# Patient Record
Sex: Female | Born: 1970 | Race: Black or African American | Hispanic: No | Marital: Married | Smoking: Never smoker
Health system: Southern US, Community
[De-identification: ages and names within clinical notes are randomized; demographics above are authoritative.]

## PROBLEM LIST (undated history)

## (undated) DIAGNOSIS — IMO0001 Reserved for inherently not codable concepts without codable children: Secondary | ICD-10-CM

## (undated) DIAGNOSIS — E039 Hypothyroidism, unspecified: Secondary | ICD-10-CM

## (undated) DIAGNOSIS — K219 Gastro-esophageal reflux disease without esophagitis: Secondary | ICD-10-CM

## (undated) DIAGNOSIS — R002 Palpitations: Secondary | ICD-10-CM

## (undated) DIAGNOSIS — R030 Elevated blood-pressure reading, without diagnosis of hypertension: Secondary | ICD-10-CM

## (undated) DIAGNOSIS — Z9289 Personal history of other medical treatment: Secondary | ICD-10-CM

## (undated) HISTORY — DX: Gastro-esophageal reflux disease without esophagitis: K21.9

## (undated) HISTORY — DX: Personal history of other medical treatment: Z92.89

## (undated) HISTORY — DX: Palpitations: R00.2

## (undated) HISTORY — DX: Elevated blood-pressure reading, without diagnosis of hypertension: R03.0

## (undated) HISTORY — DX: Hypothyroidism, unspecified: E03.9

## (undated) HISTORY — DX: Reserved for inherently not codable concepts without codable children: IMO0001

## (undated) HISTORY — PX: WISDOM TOOTH EXTRACTION: SHX21

---

## 1998-08-25 ENCOUNTER — Other Ambulatory Visit: Admission: RE | Admit: 1998-08-25 | Discharge: 1998-08-25 | Payer: Self-pay | Admitting: Obstetrics and Gynecology

## 1999-11-30 ENCOUNTER — Emergency Department (HOSPITAL_COMMUNITY): Admission: EM | Admit: 1999-11-30 | Discharge: 1999-11-30 | Payer: Self-pay | Admitting: Emergency Medicine

## 2000-08-14 ENCOUNTER — Other Ambulatory Visit: Admission: RE | Admit: 2000-08-14 | Discharge: 2000-08-14 | Payer: Self-pay | Admitting: Obstetrics and Gynecology

## 2001-02-15 ENCOUNTER — Inpatient Hospital Stay (HOSPITAL_COMMUNITY): Admission: AD | Admit: 2001-02-15 | Discharge: 2001-02-15 | Payer: Self-pay | Admitting: Obstetrics & Gynecology

## 2001-02-16 ENCOUNTER — Inpatient Hospital Stay (HOSPITAL_COMMUNITY): Admission: AD | Admit: 2001-02-16 | Discharge: 2001-02-18 | Payer: Self-pay | Admitting: Obstetrics & Gynecology

## 2001-04-03 ENCOUNTER — Other Ambulatory Visit: Admission: RE | Admit: 2001-04-03 | Discharge: 2001-04-03 | Payer: Self-pay | Admitting: Obstetrics and Gynecology

## 2002-05-25 ENCOUNTER — Other Ambulatory Visit: Admission: RE | Admit: 2002-05-25 | Discharge: 2002-05-25 | Payer: Self-pay | Admitting: Obstetrics and Gynecology

## 2002-08-17 ENCOUNTER — Ambulatory Visit (HOSPITAL_COMMUNITY): Admission: RE | Admit: 2002-08-17 | Discharge: 2002-08-17 | Payer: Self-pay | Admitting: Physical Therapy

## 2002-08-17 ENCOUNTER — Encounter: Admission: RE | Admit: 2002-08-17 | Discharge: 2002-08-17 | Payer: Self-pay | Admitting: Obstetrics and Gynecology

## 2002-08-17 ENCOUNTER — Encounter: Payer: Self-pay | Admitting: Obstetrics and Gynecology

## 2003-06-09 ENCOUNTER — Other Ambulatory Visit: Admission: RE | Admit: 2003-06-09 | Discharge: 2003-06-09 | Payer: Self-pay | Admitting: Obstetrics and Gynecology

## 2004-10-02 ENCOUNTER — Other Ambulatory Visit: Admission: RE | Admit: 2004-10-02 | Discharge: 2004-10-02 | Payer: Self-pay | Admitting: Obstetrics and Gynecology

## 2005-05-28 ENCOUNTER — Other Ambulatory Visit: Admission: RE | Admit: 2005-05-28 | Discharge: 2005-05-28 | Payer: Self-pay | Admitting: Obstetrics and Gynecology

## 2011-05-16 HISTORY — PX: INTRAUTERINE DEVICE (IUD) INSERTION: SHX5877

## 2012-12-10 ENCOUNTER — Encounter: Payer: Self-pay | Admitting: Cardiology

## 2012-12-10 ENCOUNTER — Encounter: Payer: Self-pay | Admitting: *Deleted

## 2012-12-10 ENCOUNTER — Other Ambulatory Visit: Payer: Self-pay | Admitting: *Deleted

## 2012-12-10 ENCOUNTER — Ambulatory Visit (INDEPENDENT_AMBULATORY_CARE_PROVIDER_SITE_OTHER): Payer: BC Managed Care – PPO | Admitting: Cardiology

## 2012-12-10 VITALS — BP 142/90 | HR 76 | Ht 66.5 in | Wt 195.0 lb

## 2012-12-10 DIAGNOSIS — I10 Essential (primary) hypertension: Secondary | ICD-10-CM

## 2012-12-10 DIAGNOSIS — R002 Palpitations: Secondary | ICD-10-CM

## 2012-12-10 DIAGNOSIS — R9431 Abnormal electrocardiogram [ECG] [EKG]: Secondary | ICD-10-CM

## 2012-12-10 NOTE — Patient Instructions (Addendum)
Your physician has recommended that you wear a holter monitor. Holter monitors are medical devices that record the heart's electrical activity. Doctors most often use these monitors to diagnose arrhythmias. Arrhythmias are problems with the speed or rhythm of the heartbeat. The monitor is a small, portable device. You can wear one while you do your normal daily activities. This is usually used to diagnose what is causing palpitations/syncope (passing out). 48 hour monitor  Your physician has requested that you have an echocardiogram. Echocardiography is a painless test that uses sound waves to create images of your heart. It provides your doctor with information about the size and shape of your heart and how well your heart's chambers and valves are working. This procedure takes approximately one hour. There are no restrictions for this procedure.  Your physician has requested that you regularly monitor and record your blood pressure readings at home. Please use the same machine at the same time of day to check your readings. I will call you in 2 weeks to get the readings. Luana Shu [phone]  Your physician recommends that you schedule a follow-up appointment in: 2 months with NP or PA.  Take Nexium every day.

## 2012-12-11 DIAGNOSIS — R9431 Abnormal electrocardiogram [ECG] [EKG]: Secondary | ICD-10-CM | POA: Insufficient documentation

## 2012-12-11 DIAGNOSIS — I10 Essential (primary) hypertension: Secondary | ICD-10-CM | POA: Insufficient documentation

## 2012-12-11 DIAGNOSIS — R002 Palpitations: Secondary | ICD-10-CM | POA: Insufficient documentation

## 2012-12-11 NOTE — Progress Notes (Signed)
Patient ID: Sheena Miller, female   DOB: [birth year], 42 y.o.   MRN: [id number] PCP: Candice Camp  42 yo presents for evaluation of palpitations.  Patient feels her heart flutter/skip beats.  This happened once before in 2009.  The heart fluttering was associated with reflux symptoms and resolved with Nexium use.  More recently, she has been having episodes for the last few months of heart fluttering/skipping again.  Episodes occur throughout the day and will recur for several days before stopping for a week or so then restarting.  She does not feel her heart race, it just seems irregular and fluttery.  It is bothersome but does not cause lightheadedness or syncope/presyncope.  Episodes do seem to be related to GERD symptoms (substernal burning) after meals.  Nexium helps the GERD but has not really helped the palpitations this time so she is not taking it.  Her TSH was normal at her PCP's office per her report about a month ago.  She carries the diagnosis of HTN and BP is borderline elevated today.  She tells me that she checks her BP on a cuff at home and it is almost always within normal range.   ECG: NSR, inferior T wave inversions, anterolateral T wave inversions  PMH: 1. Hypothyroidism 2. HTN (no meds at this time) 3. GERD  SH: Lives in [place], works in a Medical laboratory scientific officer, nonsmoker  FH: No premature CAD, no known arrhythmias  ROS: All systems reviewed and negative except as per HPI.   Current Outpatient Prescriptions  Medication Sig Dispense Refill  . levonorgestrel (MIRENA) 20 MCG/24HR IUD 1 each by Intrauterine route once.      Marland Kitchen levothyroxine (SYNTHROID, LEVOTHROID) 112 MCG tablet Take 1 tablet by mouth daily.       No current facility-administered medications for this visit.    BP 142/90  Pulse 76  Ht 5' 6.5" (1.689 m)  Wt 195 lb (88.451 kg)  BMI 31.01 kg/m2 General: NAD Neck: No JVD, no thyromegaly or thyroid nodule.  Lungs: Clear to auscultation bilaterally with normal  respiratory effort. CV: Nondisplaced PMI.  Heart regular S1/S2, no S3/S4, no murmur.  No peripheral edema.  No carotid bruit.  Normal pedal pulses.  Abdomen: Soft, nontender, no hepatosplenomegaly, no distention.  Skin: Intact without lesions or rashes.  Neurologic: Alert and oriented x 3.  Psych: Normal affect. Extremities: No clubbing or cyanosis.  HEENT: Normal.   Assessment/Plan: 1. Palpitations: By history, I suspect PACs or PVCs.  They do seem to be associated with GERD.  I will have her wear a 48 hour monitor to document and to make sure we are not missing paroxysmal atrial fibrillation.  She does not drink much caffeine and says she gets a good amount of sleep at night.  Thyroid indices were normal when last checked recently (on Levoxyl).  I suggested that she try going back on Nexium every day to see if the palpitations will subside over time if she suppresses the GERD.   2. HTN: Borderline in office, says BP is normal at home.  She has a home cuff.  She will check it daily at various times for 2 wks and record readings.  I will have my nurse call her in 2 wks to see what BP is running.  3. Abnormal ECG: Could this be related to LVH from HTN? I will get an echo to make sure that heart is structurally normal.  This may simply be the variant that will be normal  for her.   She will followup in a few weeks to see how the palpitations are doing while on Nexium.   Marca Ancona 12/11/2012

## 2012-12-19 ENCOUNTER — Telehealth: Payer: Self-pay | Admitting: *Deleted

## 2012-12-19 ENCOUNTER — Encounter (INDEPENDENT_AMBULATORY_CARE_PROVIDER_SITE_OTHER): Payer: BC Managed Care – PPO

## 2012-12-19 ENCOUNTER — Ambulatory Visit (HOSPITAL_COMMUNITY): Payer: BC Managed Care – PPO | Attending: Cardiology | Admitting: Radiology

## 2012-12-19 ENCOUNTER — Other Ambulatory Visit: Payer: Self-pay

## 2012-12-19 DIAGNOSIS — R9431 Abnormal electrocardiogram [ECG] [EKG]: Secondary | ICD-10-CM

## 2012-12-19 DIAGNOSIS — R002 Palpitations: Secondary | ICD-10-CM

## 2012-12-19 NOTE — Telephone Encounter (Signed)
48 hr holter monitor placed on Pt 12/19/12 TK

## 2012-12-19 NOTE — Progress Notes (Signed)
Echocardiogram performed.  

## 2012-12-24 ENCOUNTER — Telehealth: Payer: Self-pay | Admitting: *Deleted

## 2012-12-24 NOTE — Telephone Encounter (Signed)
F/u ° ° °Pt returned your call °

## 2012-12-24 NOTE — Telephone Encounter (Signed)
BPs ok 

## 2012-12-24 NOTE — Telephone Encounter (Signed)
Pt.notified

## 2012-12-24 NOTE — Telephone Encounter (Signed)
.   HTN: Borderline in office, says BP is normal at home. She has a home cuff. She will check it daily at various times for 2 wks and record readings. I will have my nurse call her in 2 wks to see what BP is running.    Spoke with patient--recent BP readings--12/11/12 117/84 12/12/12 132/86 12/13/12 127/81 12/14/12 129/88 12/15/12 112/65 12/16/12 117/74 12/17/12 130/90 12/18/12 126/90 12/19/12 130/84 12/21/12 126/85 12/22/12 138/93. I will forward to Dr Shirlee Latch for review.

## 2013-01-08 ENCOUNTER — Telehealth: Payer: Self-pay | Admitting: *Deleted

## 2013-01-08 NOTE — Telephone Encounter (Signed)
Dr Shirlee Latch reviewed monitor done 12/19/12 --pt notified of results.

## 2013-02-09 ENCOUNTER — Ambulatory Visit (INDEPENDENT_AMBULATORY_CARE_PROVIDER_SITE_OTHER): Payer: BC Managed Care – PPO | Admitting: Physician Assistant

## 2013-02-09 ENCOUNTER — Encounter: Payer: Self-pay | Admitting: Physician Assistant

## 2013-02-09 VITALS — BP 140/90 | HR 64 | Ht 66.5 in | Wt 198.0 lb

## 2013-02-09 DIAGNOSIS — I10 Essential (primary) hypertension: Secondary | ICD-10-CM

## 2013-02-09 DIAGNOSIS — R002 Palpitations: Secondary | ICD-10-CM

## 2013-02-09 DIAGNOSIS — K219 Gastro-esophageal reflux disease without esophagitis: Secondary | ICD-10-CM | POA: Insufficient documentation

## 2013-02-09 MED ORDER — ESOMEPRAZOLE MAGNESIUM 40 MG PO CPDR: 40.0000 mg | DELAYED_RELEASE_CAPSULE | Freq: Every day | ORAL | Status: DC

## 2013-02-09 NOTE — Progress Notes (Signed)
[address] [place], [place]  [address] Phone: [phone] Fax:  [phone]  Date:  02/09/2013   ID:  Sheena Miller, DOB 1970-12-14, MRN [id number]  PCP:  Turner Daniels, MD  Cardiologist:  Dr. Marca Ancona     History of Present Illness: Sheena Miller is a 42 y.o. female who returns for follow up on palpitations.   She saw Dr. Marca Ancona recently for evaluation of palpitations.  This happened once before in 2009. The heart fluttering was associated with reflux symptoms and resolved with Nexium use. More recently, she had been having episodes for the last few months of heart fluttering/skipping again. Episodes occur throughout the day and will recur for several days before stopping for a week or so then restarting. She does not feel her heart race, it just seems irregular and fluttery. It is bothersome but does not cause lightheadedness or syncope/presyncope. Episodes do seem to be related to GERD symptoms (substernal burning) after meals. Nexium helps the GERD but had not really helped the palpitations this time so she had not been taking it. Her TSH was normal at her PCP's office per her report about a month prior.  ECG was abnormal.  Dr. Marca Ancona arranged an echo and Holter monitor.  Echo 6/14: EF 55-65%, normal wall motion. 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  Since going back on her Nexium, she has had no further palpitations. She denies chest pain, shortness of breath, syncope.  Wt Readings from Last 3 Encounters:  02/09/13 198 lb (89.812 kg)  12/10/12 195 lb (88.451 kg)     Past Medical History  Diagnosis Date  . 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  . Elevated  blood pressure     no meds at this time  . Hypothyroidism   . GERD (gastroesophageal reflux disease)   . Hx of echocardiogram     Echo 6/14: EF 55-65%, normal wall motion.    Current Outpatient Prescriptions  Medication Sig Dispense Refill  . esomeprazole (NEXIUM) 40 MG capsule Take 40 mg by mouth daily before breakfast.      . levonorgestrel (MIRENA) 20 MCG/24HR IUD 1 each by Intrauterine route once.      Marland Kitchen levothyroxine (SYNTHROID, LEVOTHROID) 112 MCG tablet Take 1 tablet by mouth daily.       No current facility-administered medications for this visit.    Allergies:    Allergies  Allergen Reactions  . Dust Mite Extract   . Pollen Extract     Social History:  The patient  reports that she has never smoked. She has never used smokeless tobacco. She reports that she does not use illicit drugs.   ROS:  Please see the history of present illness.   She denies dysphagia, odynophagia, melena, hematochezia, unintended weight loss.   All other systems reviewed and negative.   PHYSICAL EXAM: VS:  BP 140/90  Pulse 64  Ht 5' 6.5" (1.689 m)  Wt 198 lb (89.812 kg)  BMI 31.48 kg/m2 Well nourished, well developed, in no acute distress HEENT: normal Neck: no JVD Cardiac:  normal S1, S2; RRR; no murmur Lungs:  clear to auscultation bilaterally, no wheezing, rhonchi or rales  Abd: soft, nontender, no hepatomegaly Ext: no edema Skin: warm and dry Neuro:  CNs 2-12 intact, no focal abnormalities noted  EKG:  NSR, HR 64, normal axis, NSSTTW changes     ASSESSMENT AND PLAN:  1. Palpitations:  Resolve with treatment of GERD. Continue PPI. 2. GERD:  Continue PPI. No high-risk symptoms noted. 3. Hypertension:  Blood pressures at home are optimal. She likely has "whitecoat hypertension." 4. Disposition:  Follow up with Dr. Shirlee Latch in 6 months.  Signed, Tereso Newcomer, PA-C  02/09/2013 8:45 AM

## 2013-02-09 NOTE — Patient Instructions (Addendum)
No change in your medications today. Schedule follow up with Dr. Marca Ancona in 6 months.

## 2016-01-30 ENCOUNTER — Other Ambulatory Visit: Payer: Self-pay | Admitting: Obstetrics and Gynecology

## 2016-01-30 DIAGNOSIS — R928 Other abnormal and inconclusive findings on diagnostic imaging of breast: Secondary | ICD-10-CM

## 2016-01-31 ENCOUNTER — Other Ambulatory Visit: Payer: Self-pay | Admitting: Obstetrics and Gynecology

## 2016-01-31 DIAGNOSIS — R928 Other abnormal and inconclusive findings on diagnostic imaging of breast: Secondary | ICD-10-CM

## 2016-02-03 ENCOUNTER — Ambulatory Visit
Admission: RE | Admit: 2016-02-03 | Discharge: 2016-02-03 | Disposition: A | Discharge status: Home / Self Care | Payer: BLUE CROSS/BLUE SHIELD | Source: Ambulatory Visit | Attending: Obstetrics and Gynecology | Admitting: Obstetrics and Gynecology

## 2016-02-03 DIAGNOSIS — R928 Other abnormal and inconclusive findings on diagnostic imaging of breast: Secondary | ICD-10-CM

## 2018-01-09 IMAGING — MG 2D DIGITAL DIAGNOSTIC UNILATERAL RIGHT MAMMOGRAM WITH CAD AND AD
6 of 9 series · 6 of 21 positions shown · non-contrast
Comparison: Previous exam(s).

CLINICAL DATA: Patient was called back from screening mammogram for
a possible mass in the right breast.

EXAM:
2D DIGITAL DIAGNOSTIC RIGHT MAMMOGRAM WITH CAD AND ADJUNCT TOMO
ULTRASOUND RIGHT BREAST

[R MLO]
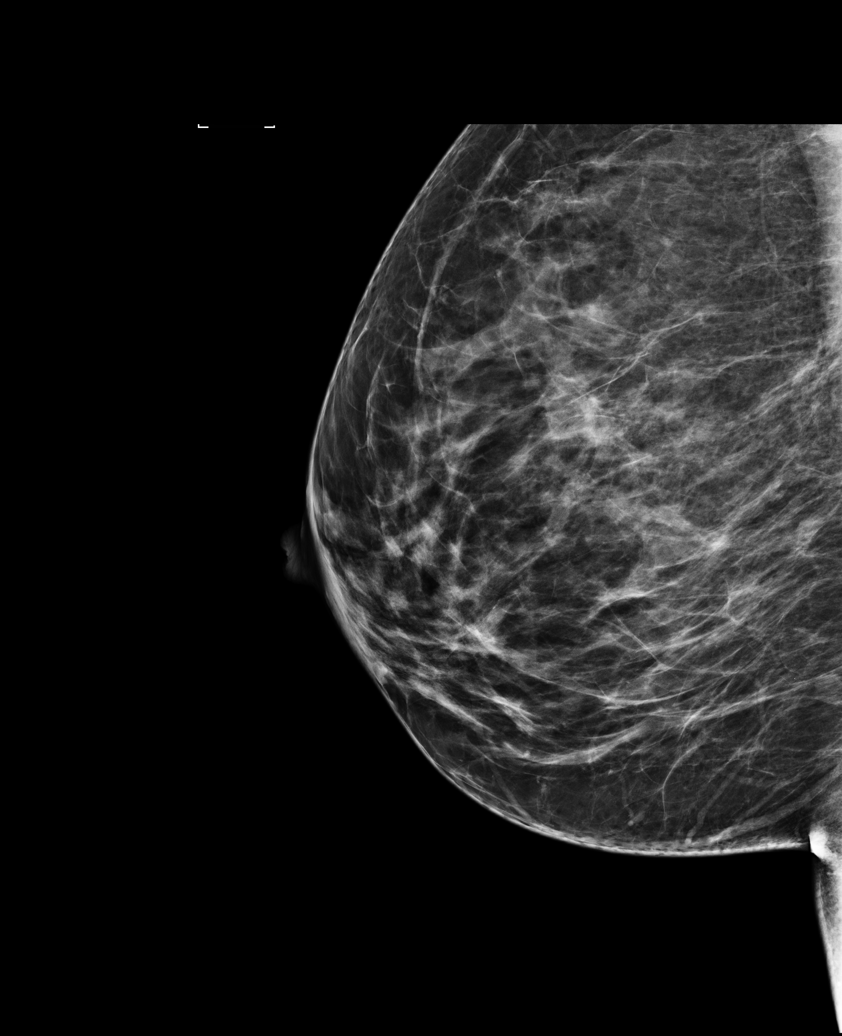

[R CC]
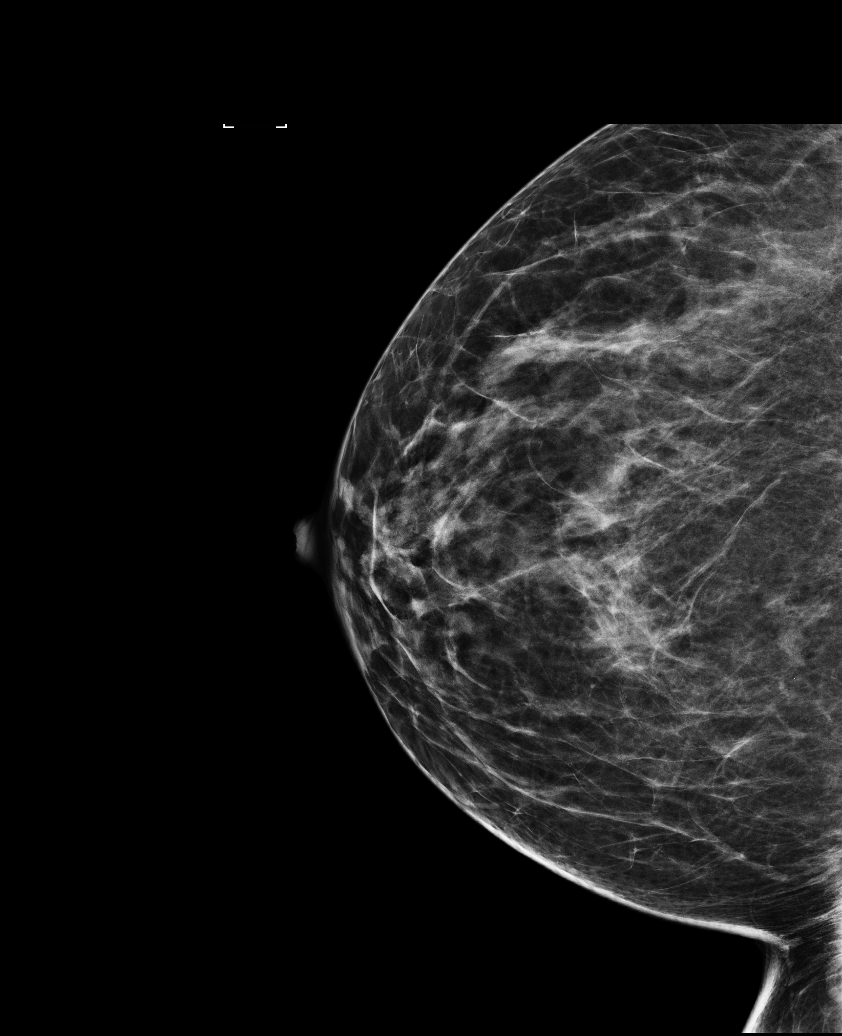

[R MLO synth-2D]
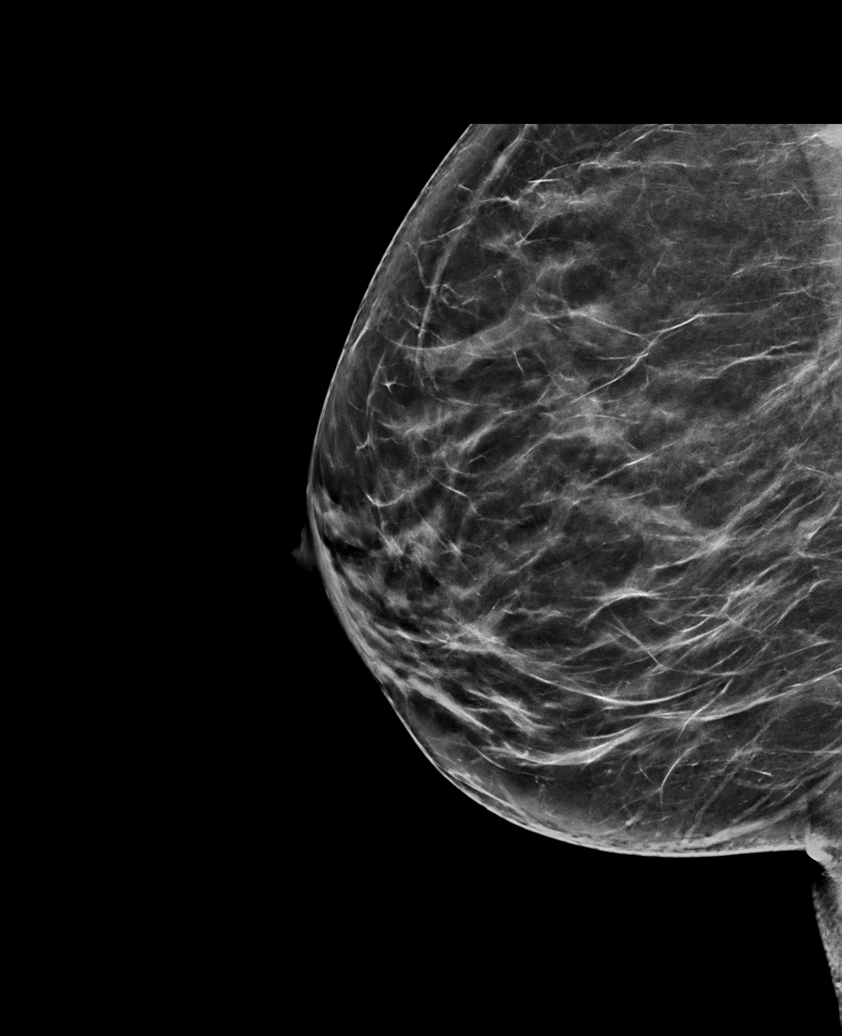

[R XCCL synth-2D]
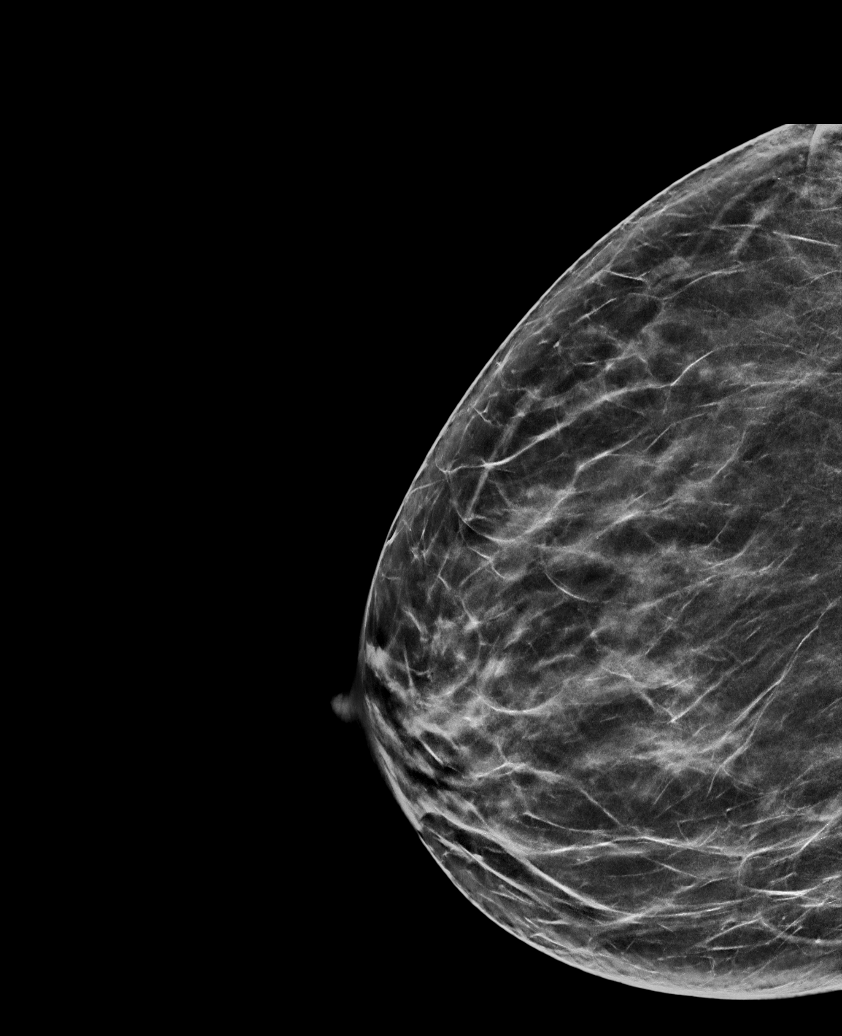

[R XCCL]
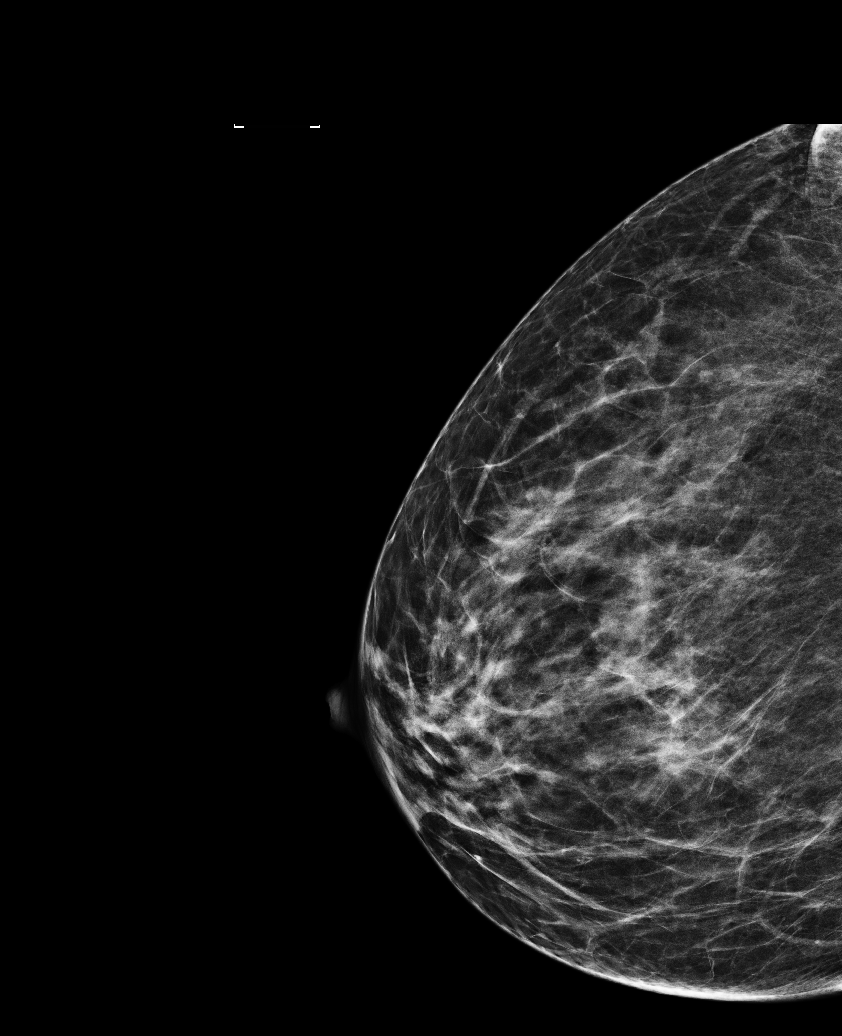

[R CC synth-2D]
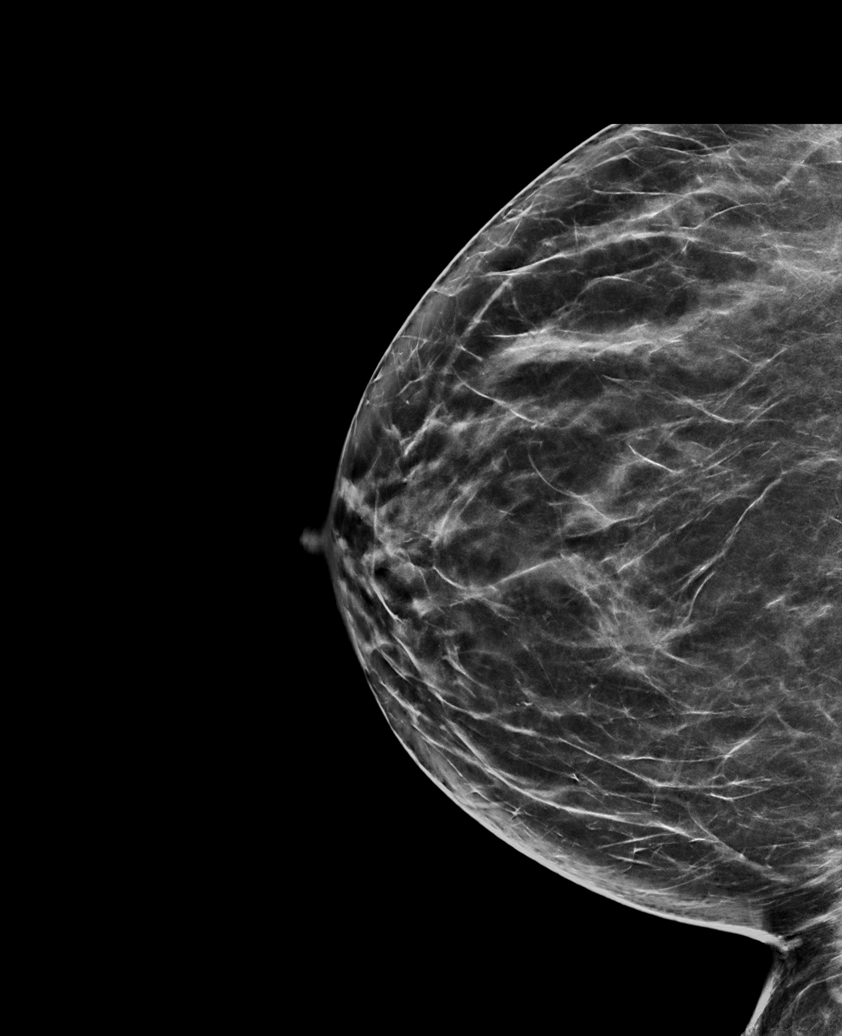

[6 of 21 positions shown; findings below may reference images not displayed]

ACR Breast Density Category b: There are scattered areas of
fibroglandular density.
FINDINGS: Additional imaging of the right breast was performed. Far
posteriorly in the inferior aspect of the right breast is a
well-circumscribed 7 mm nodule. It has internal fat. On the CC view
the nodule is felt to be medial. It is felt to have a similar
appearance to the 01/07/2015 study on the MLO view. Mammogram prior
to this dated 11/06/2013 tissue was not imaged this far posteriorly.
There are no malignant type microcalcifications.

Mammographic images were processed with CAD.

On physical exam, I do not palpate a mass in the inferior aspect of
the right breast.

Targeted ultrasound is performed, showing normal tissue throughout
the entirety inferior aspect of the right breast. No solid or cystic
mass, abnormal shadowing or distortion visualized.
IMPRESSION: Probable benign nodule in the right breast.

RECOMMENDATION:
Short-term interval followup right mammogram possible ultrasound in
6 months is recommended.

I have discussed the findings and recommendations with the patient.
Results were also provided in writing at the conclusion of the
visit. If applicable, a reminder letter will be sent to the patient
regarding the next appointment.

BI-RADS CATEGORY  3: Probably benign.

## 2019-06-18 ENCOUNTER — Other Ambulatory Visit: Payer: Self-pay | Admitting: Obstetrics and Gynecology

## 2019-06-18 DIAGNOSIS — N6310 Unspecified lump in the right breast, unspecified quadrant: Secondary | ICD-10-CM

## 2019-06-22 ENCOUNTER — Other Ambulatory Visit: Payer: BLUE CROSS/BLUE SHIELD

## 2019-06-29 ENCOUNTER — Other Ambulatory Visit: Payer: Self-pay

## 2019-06-29 ENCOUNTER — Ambulatory Visit
Admission: RE | Admit: 2019-06-29 | Discharge: 2019-06-29 | Disposition: A | Discharge status: Home / Self Care | Payer: BLUE CROSS/BLUE SHIELD | Source: Ambulatory Visit | Attending: Obstetrics and Gynecology | Admitting: Obstetrics and Gynecology

## 2019-06-29 DIAGNOSIS — N6310 Unspecified lump in the right breast, unspecified quadrant: Secondary | ICD-10-CM

## 2021-06-04 IMAGING — US US BREAST*R* LIMITED INC AXILLA
2 series · 13 of 13 positions shown · non-contrast
Comparison: Previous exam(s).

CLINICAL DATA: Interval follow-up of a likely benign mass or focal
asymmetry in the INNER RIGHT breast at POSTERIOR depth. Annual
evaluation, LEFT breast. The patient's most recent prior mammogram
was in January 2016.

EXAM:
DIGITAL DIAGNOSTIC BILATERAL MAMMOGRAM WITH CAD AND TOMO
ULTRASOUND RIGHT BREAST

[Series 1: us breast*right* limited inc axilla · 0.07mm/px · 7 of 7 slices shown (1 of 2)]
[im 1/7]
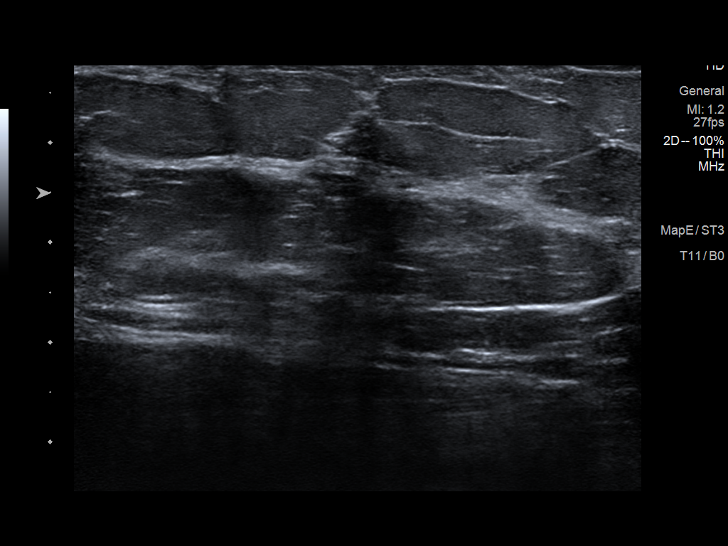
[im 2/7]
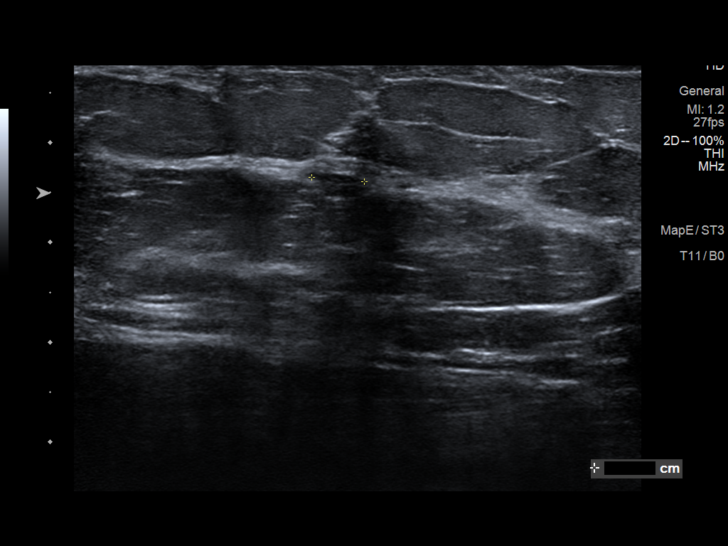
[im 3/7]
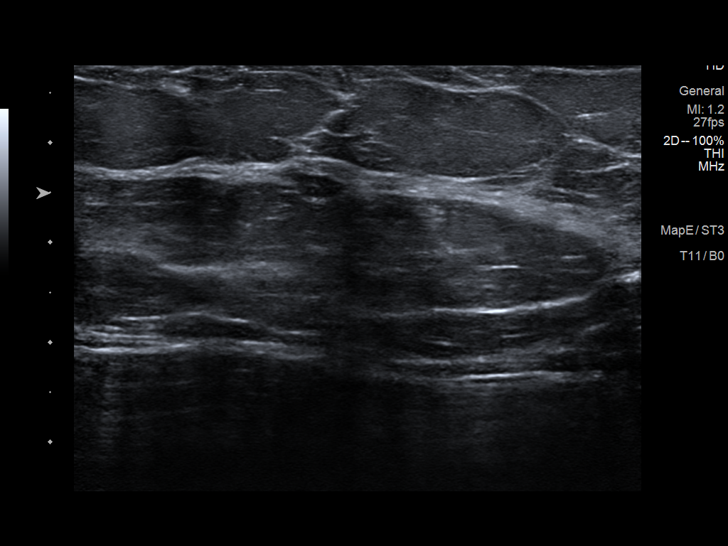
[im 4/7]
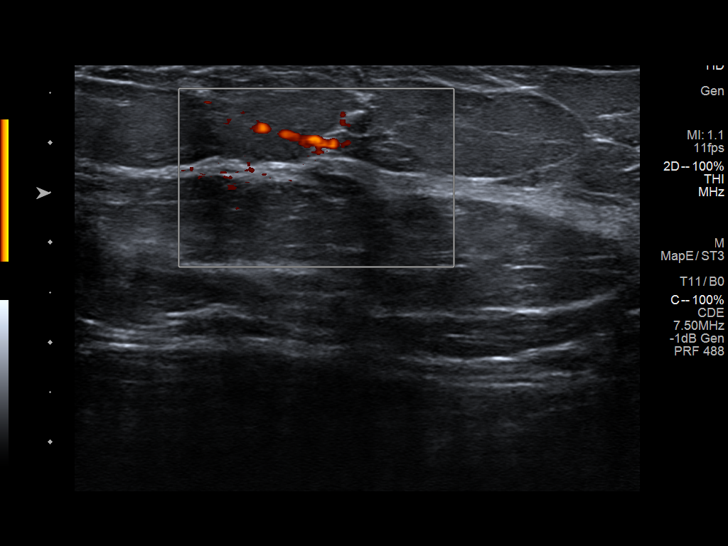
[im 5/7]
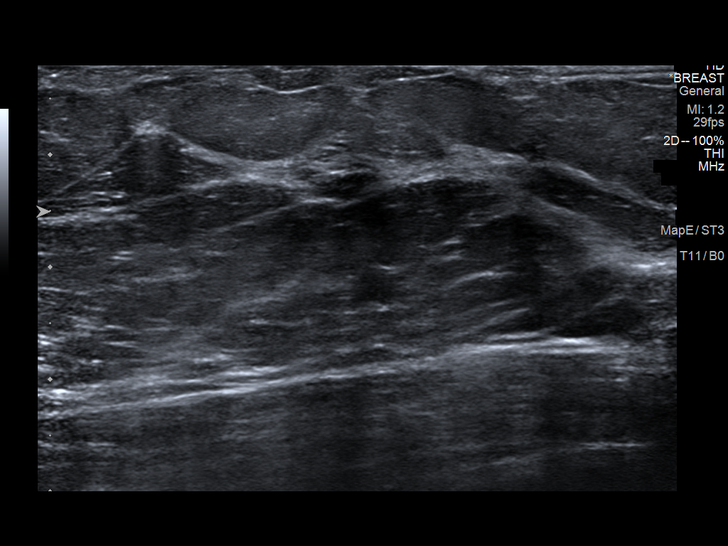
[im 6/7]
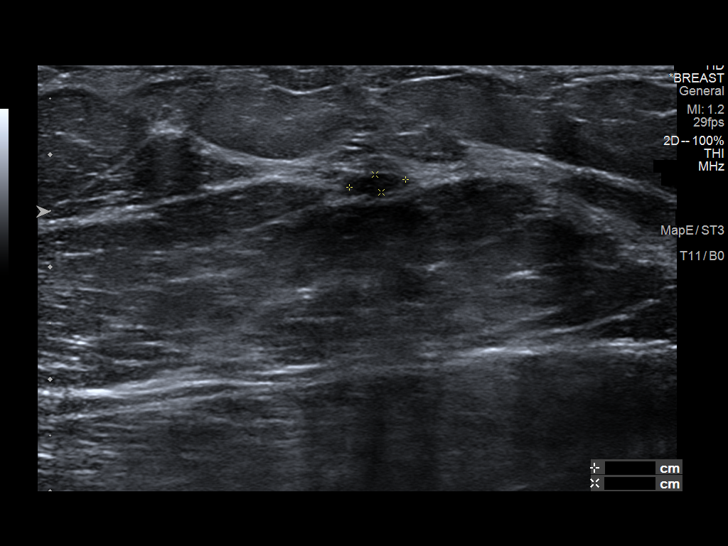
[im 7/7]
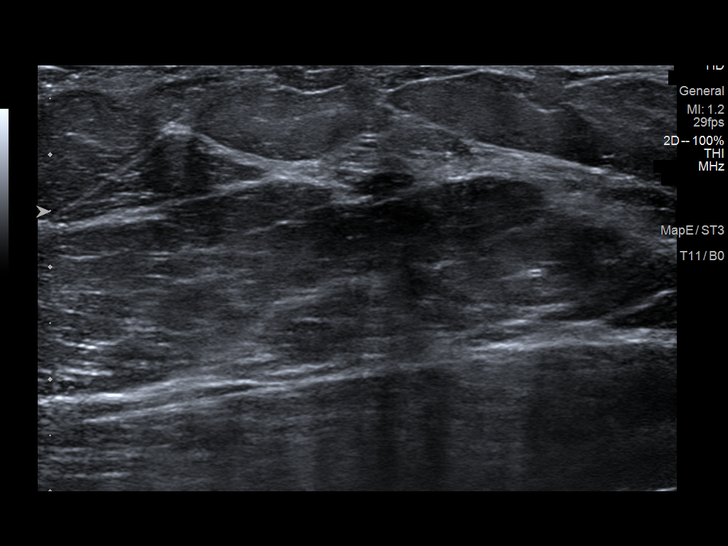

[Series 2: us breast*right* limited inc axilla · 0.06mm/px · 6 of 6 slices shown (2 of 2)]
[im 1/6]
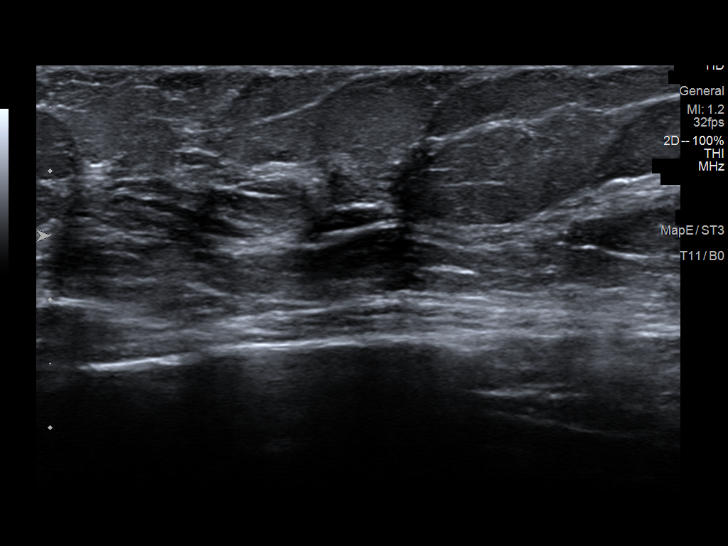
[im 2/6]
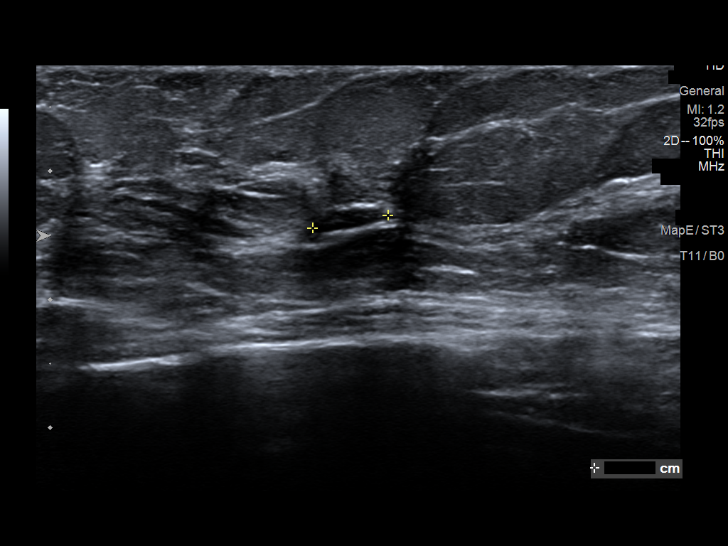
[im 3/6]
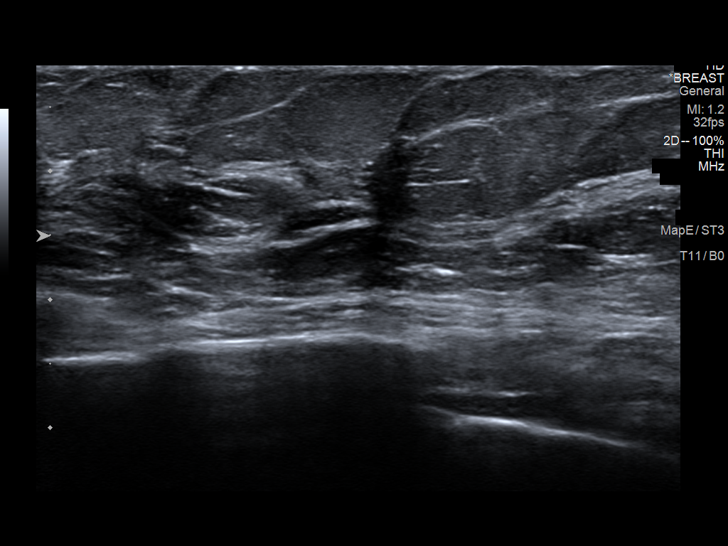
[im 4/6]
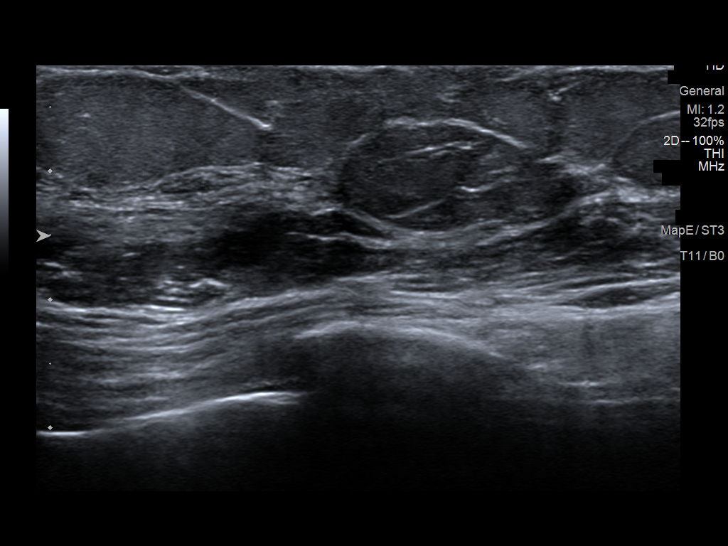
[im 5/6]
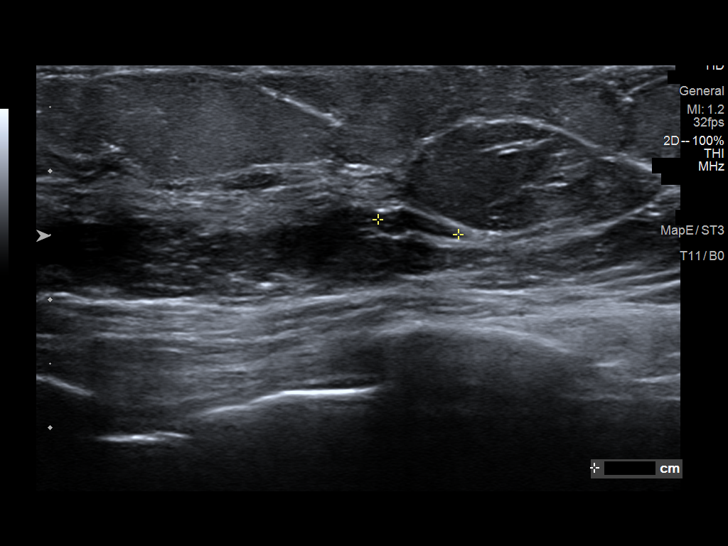
[im 6/6]
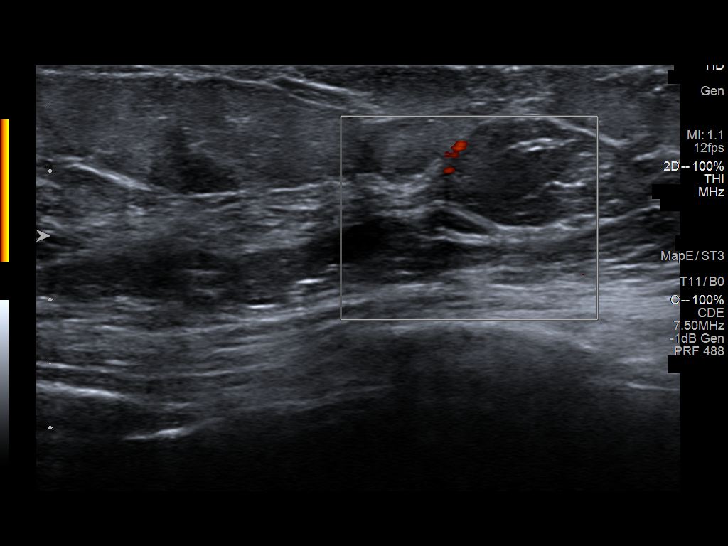

[13 of 13 positions shown; findings below may reference images not displayed]

ACR Breast Density Category b: There are scattered areas of
fibroglandular density.
FINDINGS: Tomosynthesis and synthesized full field CC and MLO views of both
breasts were obtained.

The low-density mass which is partially imaged on the MLO view is
unchanged dating back to the 3504 mammogram. It is difficult to
localize the mass on the CC images. No new or suspicious findings
are identified elsewhere in the RIGHT breast.

No findings suspicious for malignancy in the LEFT breast.

Mammographic images were processed with CAD.

Targeted RIGHT breast ultrasound is performed, showing an oval
circumscribed parallel nearly anechoic mass at the 1 o'clock
position approximately 3 cm at MIDDLE depth measuring approximately
2 x 5 x 5 mm, demonstrating posterior acoustic enhancement and no
internal power Doppler flow. This does not correlate with the
mammographic finding.

At the 7 o'clock position approximately 5 cm from the nipple at
POSTERIOR depth is an oval circumscribed parallel nearly anechoic
mass measuring approximately 2 x 6 x 6 mm, demonstrating posterior
acoustic enhancement and no internal power Doppler flow, correlating
with the mammographic finding.

No suspicious solid mass or abnormal acoustic shadowing is
identified.
IMPRESSION: 1. Benign cysts in the RIGHT breast, including a cyst in the LOWER
OUTER QUADRANT at POSTERIOR depth which accounts for the
mammographic finding.
2. No mammographic or sonographic evidence of malignancy involving
the RIGHT breast.
3. No mammographic evidence of malignancy involving the LEFT breast.

RECOMMENDATION:
Screening mammogram in one year.(Code:[id number]-[id number])

I have discussed the findings and recommendations with the patient.
If applicable, a reminder letter will be sent to the patient
regarding the next appointment.

BI-RADS CATEGORY  2: Benign.

## 2022-07-24 ENCOUNTER — Ambulatory Visit: Payer: BC Managed Care – PPO | Admitting: Family Medicine

## 2022-09-07 ENCOUNTER — Ambulatory Visit (INDEPENDENT_AMBULATORY_CARE_PROVIDER_SITE_OTHER): Payer: BC Managed Care – PPO | Admitting: Family Medicine

## 2022-09-07 ENCOUNTER — Encounter: Payer: Self-pay | Admitting: Family Medicine

## 2022-09-07 VITALS — BP 126/82 | HR 102 | Temp 98.0°F | Ht 66.5 in | Wt 208.6 lb

## 2022-09-07 DIAGNOSIS — E559 Vitamin D deficiency, unspecified: Secondary | ICD-10-CM

## 2022-09-07 DIAGNOSIS — R030 Elevated blood-pressure reading, without diagnosis of hypertension: Secondary | ICD-10-CM

## 2022-09-07 DIAGNOSIS — E0500 Thyrotoxicosis with diffuse goiter without thyrotoxic crisis or storm: Secondary | ICD-10-CM | POA: Insufficient documentation

## 2022-09-07 DIAGNOSIS — Z6833 Body mass index (BMI) 33.0-33.9, adult: Secondary | ICD-10-CM

## 2022-09-07 DIAGNOSIS — E890 Postprocedural hypothyroidism: Secondary | ICD-10-CM | POA: Insufficient documentation

## 2022-09-07 NOTE — Progress Notes (Signed)
Subjective:  Patient ID: Sheena Miller, female    DOB: 1971/04/19, 52 y.o.   MRN: [id number]  Patient Care Team: Baruch Gouty, FNP as PCP - General (Family Medicine)   Chief Complaint:  New Patient (Initial Visit) (No previous PCP) and Establish Care   HPI: Sheena Miller is a 52 y.o. female presenting on 09/07/2022 for New Patient (Initial Visit) (No previous PCP) and Establish Care   Sheena Miller presents today to establish care with new PCP. She has not had a PCP in several years, has been getting her care with her GYN. She has Grave's disease and is s/p ablation with radioactive iodine, she was 51 when treated. She has been on levothyroxine for years and tolerates well, no recent adjustments in regimen. She has Vit D deficiency and his on repletion therapy, tolerating well. She reports elevated blood pressure readings at times over the last few weeks, no associated symptoms. She is not on anything for her blood pressure. She had had an elevated LDL in the past, not on medications for this either.   Prior medical records have been requested.     Relevant past medical, surgical, family, and social history reviewed and updated as indicated.  Allergies and medications reviewed and updated. Data reviewed: Chart in Epic.   Past Medical History:  Diagnosis Date   Elevated blood pressure    no meds at this time   GERD (gastroesophageal reflux disease)    Hx of echocardiogram    Echo 6/14: EF 55-65%, normal wall motion.   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    Past Surgical History:  Procedure Laterality Date   INTRAUTERINE DEVICE (IUD) INSERTION  05-16-11   Mirena   WISDOM TOOTH EXTRACTION      Social History   Socioeconomic History   Marital status: Married    Spouse name: Not on file   Number of children: 2    Years of education: Not on file   Highest education level: Not on file  Occupational History   Not on file  Tobacco Use   Smoking status: Never   Smokeless tobacco: Never  Vaping Use   Vaping Use: Never used  Substance and Sexual Activity   Alcohol use: Not Currently   Drug use: No   Sexual activity: Yes    Birth control/protection: I.U.D.  Other Topics Concern   Not on file  Social History Narrative   Not on file   Social Determinants of Health   Financial Resource Strain: Not on file  Food Insecurity: Not on file  Transportation Needs: Not on file  Physical Activity: Not on file  Stress: Not on file  Social Connections: Not on file  Intimate Partner Violence: Not on file    Outpatient Encounter Medications as of 09/07/2022  Medication Sig   Cholecalciferol (VITAMIN D3) 50 MCG (2000 UT) CAPS Take 1 capsule by mouth daily at 2 PM.   levonorgestrel (MIRENA) 20 MCG/24HR IUD 1 each by Intrauterine route once.   levothyroxine (SYNTHROID) 137 MCG tablet Take 137 mcg by mouth daily.   [DISCONTINUED] esomeprazole (NEXIUM) 40 MG capsule Take 1 capsule (40 mg total) by mouth daily before breakfast.   [DISCONTINUED] levothyroxine (SYNTHROID, LEVOTHROID) 112 MCG tablet Take 1 tablet by mouth daily.   No facility-administered encounter  medications on file as of 09/07/2022.    Allergies  Allergen Reactions   Bee Pollen    Dust Mite Extract    Pollen Extract     Review of Systems  Constitutional:  Negative for activity change, appetite change, chills, diaphoresis, fatigue, fever and unexpected weight change.  HENT: Negative.    Eyes: Negative.  Negative for photophobia and visual disturbance.  Respiratory:  Negative for cough, chest tightness and shortness of breath.   Cardiovascular:  Negative for chest pain, palpitations and leg swelling.  Gastrointestinal:  Negative for abdominal pain, blood in stool, constipation, diarrhea, nausea and vomiting.  Endocrine: Negative.    Genitourinary:  Negative for decreased urine volume, difficulty urinating, dysuria, frequency and urgency.  Musculoskeletal:  Negative for arthralgias and myalgias.  Skin: Negative.   Allergic/Immunologic: Negative.   Neurological:  Negative for dizziness, tremors, seizures, syncope, facial asymmetry, speech difficulty, weakness, light-headedness, numbness and headaches.  Hematological: Negative.   Psychiatric/Behavioral:  Negative for confusion, hallucinations, sleep disturbance and suicidal ideas.   All other systems reviewed and are negative.       Objective:  BP 126/82 Comment: home reading  Pulse (!) 102   Temp 98 F (36.7 C) (Temporal)   Ht 5' 6.5" (1.689 m)   Wt 208 lb 9.6 oz (94.6 kg)   SpO2 99%   BMI 33.16 kg/m    Wt Readings from Last 3 Encounters:  09/07/22 208 lb 9.6 oz (94.6 kg)  02/09/13 198 lb (89.8 kg)  12/10/12 195 lb (88.5 kg)    Physical Exam Vitals and nursing note reviewed.  Constitutional:      General: She is not in acute distress.    Appearance: Normal appearance. She is well-developed and well-groomed. She is obese. She is not ill-appearing, toxic-appearing or diaphoretic.  HENT:     Head: Normocephalic and atraumatic.     Jaw: There is normal jaw occlusion.     Right Ear: Hearing normal.     Left Ear: Hearing normal.     Nose: Nose normal.     Mouth/Throat:     Lips: Pink.     Mouth: Mucous membranes are moist.     Pharynx: Oropharynx is clear. Uvula midline.  Eyes:     General: Lids are normal.     Extraocular Movements: Extraocular movements intact.     Conjunctiva/sclera: Conjunctivae normal.     Pupils: Pupils are equal, round, and reactive to light.  Neck:     Thyroid: No thyroid mass, thyromegaly or thyroid tenderness.     Vascular: No carotid bruit or JVD.     Trachea: Trachea and phonation normal.  Cardiovascular:     Rate and Rhythm: Normal rate and regular rhythm.     Chest Wall: PMI is not displaced.     Pulses: Normal  pulses.     Heart sounds: Normal heart sounds. No murmur heard.    No friction rub. No gallop.  Pulmonary:     Effort: Pulmonary effort is normal. No respiratory distress.     Breath sounds: Normal breath sounds. No wheezing.  Abdominal:     General: Bowel sounds are normal. There is no distension or abdominal bruit.     Palpations: Abdomen is soft. There is no hepatomegaly or splenomegaly.     Tenderness: There is no abdominal tenderness. There is no right CVA tenderness or left CVA tenderness.     Hernia: No hernia is present.  Musculoskeletal:  General: Normal range of motion.     Cervical back: Normal range of motion and neck supple.     Right lower leg: No edema.     Left lower leg: No edema.  Lymphadenopathy:     Cervical: No cervical adenopathy.  Skin:    General: Skin is warm and dry.     Capillary Refill: Capillary refill takes less than 2 seconds.     Coloration: Skin is not cyanotic, jaundiced or pale.     Findings: No rash.  Neurological:     General: No focal deficit present.     Mental Status: She is alert and oriented to person, place, and time.     Sensory: Sensation is intact.     Motor: Motor function is intact.     Coordination: Coordination is intact.     Gait: Gait is intact.     Deep Tendon Reflexes: Reflexes are normal and symmetric.  Psychiatric:        Attention and Perception: Attention and perception normal.        Mood and Affect: Mood and affect normal.        Speech: Speech normal.        Behavior: Behavior normal. Behavior is cooperative.        Thought Content: Thought content normal.        Cognition and Memory: Cognition and memory normal.        Judgment: Judgment normal.          Pertinent labs & imaging results that were available during my care of the patient were reviewed by me and considered in my medical decision making.  Assessment & Plan:  Kamarri was seen today for new patient (initial visit) and establish  care.  Diagnoses and all orders for this visit:  Graves disease Postablative hypothyroidism Thyroid disease has been well controlled per pts report. Labs are pending. Adjustments to regimen will be made if warranted. Make sure to take medications on an empty stomach with a full glass of water. Make sure to avoid vitamins or supplements for at least 4 hours before and 4 hours after taking medications. Repeat labs in 3 months if adjustments are made and in 6 months if stable.   -     Thyroid Panel With TSH   Vitamin D deficiency Labs pending. Continue repletion therapy. If indicated, will change repletion dosage. Eat foods rich in Vit D including milk, orange juice, yogurt with vitamin D added, salmon or mackerel, canned tuna fish, cereals with vitamin D added, and cod liver oil. Get out in the sun but make sure to wear at least SPF 30 sunscreen.  -     VITAMIN D 25 Hydroxy (Vit-D Deficiency, Fractures)  BMI 33.0-33.9,adult Diet and exercise encouraged.  -     CMP14+EGFR -     CBC with Differential/Platelet -     Lipid panel -     Thyroid Panel With TSH  Elevated blood pressure reading without the diagnosis of hypertension DASH diet and exercise encouraged. BP log provided to Sheena Miller. To follow up in 6 weeks for reevaluation. Will start mediations if warranted.    Continue all other maintenance medications.  Follow up plan: Return in about 6 weeks (around 10/19/2022), or if symptoms worsen or fail to improve, for HTN.   Continue healthy lifestyle choices, including diet (rich in fruits, vegetables, and lean proteins, and low in salt and simple carbohydrates) and exercise (at least 30 minutes of moderate physical  activity daily).  Educational handout given for DASH diet, HTN  The above assessment and management plan was discussed with the patient. The patient verbalized understanding of and has agreed to the management plan. Patient is aware to call the clinic if they develop any new  symptoms or if symptoms persist or worsen. Patient is aware when to return to the clinic for a follow-up visit. Patient educated on when it is appropriate to go to the emergency department.   Monia Pouch, FNP-C [facility] [phone]

## 2022-09-07 NOTE — Patient Instructions (Addendum)
Red yeast rice 2400 mg daily   Goal BP:  For patients younger than 60: Goal BP < 140/90. For patients 60 and older: Goal BP < 150/90. For patients with diabetes: Goal BP < 140/90.  Take your medications faithfully as prescribed. Maintain a healthy weight. Get at least 150 minutes of aerobic exercise per week. Minimize salt intake, less than 2000 mg per day. Minimize alcohol intake.  DASH Eating Plan DASH stands for "Dietary Approaches to Stop Hypertension." The DASH eating plan is a healthy eating plan that has been shown to reduce high blood pressure (hypertension). Additional health benefits may include reducing the risk of type 2 diabetes mellitus, heart disease, and stroke. The DASH eating plan may also help with weight loss.  WHAT DO I NEED TO KNOW ABOUT THE DASH EATING PLAN? For the DASH eating plan, you will follow these general guidelines: Choose foods with a percent daily value for sodium of less than 5% (as listed on the food label). Use salt-free seasonings or herbs instead of table salt or sea salt. Check with your health care provider or pharmacist before using salt substitutes. Eat lower-sodium products, often labeled as "lower sodium" or "no salt added." Eat fresh foods. Eat more vegetables, fruits, and low-fat dairy products. Choose whole grains. Look for the word "whole" as the first word in the ingredient list. Choose fish and skinless chicken or [place] more often than red meat. Limit fish, poultry, and meat to 6 oz (170 g) each day. Limit sweets, desserts, sugars, and sugary drinks. Choose heart-healthy fats. Limit cheese to 1 oz (28 g) per day. Eat more home-cooked food and less restaurant, buffet, and fast food. Limit fried foods. Cook foods using methods other than frying. Limit canned vegetables. If you do use them, rinse them well to decrease the sodium. When eating at a restaurant, ask that your food be prepared with less salt, or no salt if  possible.  WHAT FOODS CAN I EAT? Seek help from a dietitian for individual calorie needs.  Grains Whole grain or whole wheat bread. Brown rice. Whole grain or whole wheat pasta. Quinoa, bulgur, and whole grain cereals. Low-sodium cereals. Corn or whole wheat flour tortillas. Whole grain cornbread. Whole grain crackers. Low-sodium crackers.  Vegetables Fresh or frozen vegetables (raw, steamed, roasted, or grilled). Low-sodium or reduced-sodium tomato and vegetable juices. Low-sodium or reduced-sodium tomato sauce and paste. Low-sodium or reduced-sodium canned vegetables.   Fruits All fresh, canned (in natural juice), or frozen fruits.  Meat and Other Protein Products Ground beef (85% or leaner), grass-fed beef, or beef trimmed of fat. Skinless chicken or [place]. Ground chicken or [place]. Pork trimmed of fat. All fish and seafood. Eggs. Dried beans, peas, or lentils. Unsalted nuts and seeds. Unsalted canned beans.  Dairy Low-fat dairy products, such as skim or 1% milk, 2% or reduced-fat cheeses, low-fat ricotta or cottage cheese, or plain low-fat yogurt. Low-sodium or reduced-sodium cheeses.  Fats and Oils Tub margarines without trans fats. Light or reduced-fat mayonnaise and salad dressings (reduced sodium). Avocado. Safflower, olive, or canola oils. Natural peanut or almond butter.  Other Unsalted popcorn and pretzels. The items listed above may not be a complete list of recommended foods or beverages. Contact your dietitian for more options.  WHAT FOODS ARE NOT RECOMMENDED?  Grains White bread. White pasta. White rice. Refined cornbread. Bagels and croissants. Crackers that contain trans fat.  Vegetables Creamed or fried vegetables. Vegetables in a cheese sauce. Regular canned vegetables. Regular canned tomato sauce  and paste. Regular tomato and vegetable juices.  Fruits Dried fruits. Canned fruit in light or heavy syrup. Fruit juice.  Meat and Other Protein Products Fatty  cuts of meat. Ribs, chicken wings, bacon, sausage, bologna, salami, chitterlings, fatback, hot dogs, bratwurst, and packaged luncheon meats. Salted nuts and seeds. Canned beans with salt.  Dairy Whole or 2% milk, cream, half-and-half, and cream cheese. Whole-fat or sweetened yogurt. Full-fat cheeses or blue cheese. Nondairy creamers and whipped toppings. Processed cheese, cheese spreads, or cheese curds.  Condiments Onion and garlic salt, seasoned salt, table salt, and sea salt. Canned and packaged gravies. Worcestershire sauce. Tartar sauce. Barbecue sauce. Teriyaki sauce. Soy sauce, including reduced sodium. Steak sauce. Fish sauce. Oyster sauce. Cocktail sauce. Horseradish. Ketchup and mustard. Meat flavorings and tenderizers. Bouillon cubes. Hot sauce. Tabasco sauce. Marinades. Taco seasonings. Relishes.  Fats and Oils Butter, stick margarine, lard, shortening, ghee, and bacon fat. Coconut, palm kernel, or palm oils. Regular salad dressings.  Other Pickles and olives. Salted popcorn and pretzels.  The items listed above may not be a complete list of foods and beverages to avoid. Contact your dietitian for more information.  WHERE CAN I FIND MORE INFORMATION? National [employer], [employer], [employer]: travelstabloid.com Document Released: 06/14/2011 Document Revised: 11/09/2013 Document Reviewed: 04/29/2013 [facility] Patient Information 2015 [place], [place]. This information is not intended to replace advice given to you by your health care provider. Make sure you discuss any questions you have with your health care provider.   I think that you would greatly benefit from seeing a nutritionist.  If you are interested, please call Dr. Jenne Campus at [phone] to schedule an appointment.

## 2022-09-08 LAB — VITAMIN D 25 HYDROXY (VIT D DEFICIENCY, FRACTURES): Vit D, 25-Hydroxy: 48.6 ng/mL (ref 30.0–100.0)

## 2022-09-08 LAB — CBC WITH DIFFERENTIAL/PLATELET
Basophils Absolute: 0 10*3/uL (ref 0.0–0.2)
Basos: 1 %
EOS (ABSOLUTE): 0.3 10*3/uL (ref 0.0–0.4)
Eos: 4 %
Hematocrit: 48.5 % — ABNORMAL HIGH (ref 34.0–46.6)
Hemoglobin: 15.5 g/dL (ref 11.1–15.9)
Immature Grans (Abs): 0 10*3/uL (ref 0.0–0.1)
Immature Granulocytes: 0 %
Lymphocytes Absolute: 2.1 10*3/uL (ref 0.7–3.1)
Lymphs: 34 %
MCH: 27.2 pg (ref 26.6–33.0)
MCHC: 32 g/dL (ref 31.5–35.7)
MCV: 85 fL (ref 79–97)
Monocytes Absolute: 0.6 10*3/uL (ref 0.1–0.9)
Monocytes: 10 %
Neutrophils Absolute: 3.3 10*3/uL (ref 1.4–7.0)
Neutrophils: 51 %
Platelets: 285 10*3/uL (ref 150–450)
RBC: 5.69 x10E6/uL — ABNORMAL HIGH (ref 3.77–5.28)
RDW: 13.4 % (ref 11.7–15.4)
WBC: 6.3 10*3/uL (ref 3.4–10.8)

## 2022-09-08 LAB — CMP14+EGFR
ALT: 15 IU/L (ref 0–32)
AST: 27 IU/L (ref 0–40)
Albumin/Globulin Ratio: 1.3 (ref 1.2–2.2)
Albumin: 4.9 g/dL (ref 3.9–4.9)
Alkaline Phosphatase: 98 IU/L (ref 44–121)
BUN/Creatinine Ratio: 21 (ref 9–23)
BUN: 15 mg/dL (ref 6–24)
Bilirubin Total: 0.3 mg/dL (ref 0.0–1.2)
CO2: 24 mmol/L (ref 20–29)
Calcium: 10.6 mg/dL — ABNORMAL HIGH (ref 8.7–10.2)
Chloride: 100 mmol/L (ref 96–106)
Creatinine, Ser: 0.73 mg/dL (ref 0.57–1.00)
Globulin, Total: 3.7 g/dL (ref 1.5–4.5)
Glucose: 91 mg/dL (ref 70–99)
Potassium: 4.8 mmol/L (ref 3.5–5.2)
Sodium: 139 mmol/L (ref 134–144)
Total Protein: 8.6 g/dL — ABNORMAL HIGH (ref 6.0–8.5)
eGFR: 101 mL/min/{1.73_m2} (ref >59)

## 2022-09-08 LAB — THYROID PANEL WITH TSH
Free Thyroxine Index: 2.6 (ref 1.2–4.9)
T3 Uptake Ratio: 24 % (ref 24–39)
T4, Total: 11 ug/dL (ref 4.5–12.0)
TSH: 5.03 u[IU]/mL — ABNORMAL HIGH (ref 0.450–4.500)

## 2022-09-08 LAB — LIPID PANEL
Chol/HDL Ratio: 3.1 ratio (ref 0.0–4.4)
Cholesterol, Total: 242 mg/dL — ABNORMAL HIGH (ref 100–199)
HDL: 78 mg/dL (ref >39)
LDL Chol Calc (NIH): 152 mg/dL — ABNORMAL HIGH (ref 0–99)
Triglycerides: 72 mg/dL (ref 0–149)
VLDL Cholesterol Cal: 12 mg/dL (ref 5–40)

## 2022-09-16 ENCOUNTER — Other Ambulatory Visit: Payer: Self-pay | Admitting: Family Medicine

## 2022-09-21 ENCOUNTER — Encounter: Payer: Self-pay | Admitting: Family Medicine

## 2022-10-19 ENCOUNTER — Encounter: Payer: Self-pay | Admitting: Family Medicine

## 2022-10-19 ENCOUNTER — Ambulatory Visit (INDEPENDENT_AMBULATORY_CARE_PROVIDER_SITE_OTHER): Payer: BC Managed Care – PPO | Admitting: Family Medicine

## 2022-10-19 VITALS — BP 132/86 | HR 97 | Temp 97.8°F | Ht 66.5 in | Wt 203.4 lb

## 2022-10-19 DIAGNOSIS — E0500 Thyrotoxicosis with diffuse goiter without thyrotoxic crisis or storm: Secondary | ICD-10-CM

## 2022-10-19 DIAGNOSIS — I10 Essential (primary) hypertension: Secondary | ICD-10-CM | POA: Diagnosis not present

## 2022-10-19 NOTE — Patient Instructions (Signed)

## 2022-10-19 NOTE — Progress Notes (Signed)
Subjective:  Patient ID: Sheena Miller, female    DOB: 1971-03-10, 52 y.o.   MRN: [id number]  Patient Care Team: Sonny Masters, FNP as PCP - General (Family Medicine)   Chief Complaint:  Hypertension (6 week follow up )   HPI: Sheena Miller is a 52 y.o. female presenting on 10/19/2022 for Hypertension (6 week follow up )   Hypertension The problem has been gradually improving since onset. Pertinent negatives include no anxiety, blurred vision, chest pain, headaches, malaise/fatigue, neck pain, orthopnea, palpitations, peripheral edema, PND, shortness of breath or sweats. There are no associated agents to hypertension. Risk factors for coronary artery disease include obesity. Past treatments include lifestyle changes. The current treatment provides moderate improvement. There are no compliance problems.   BP log form home reviewed with noted good readings. Has changed diet and is exercising daily.  Calcium was slightly elevated with labs. TSH was minimally elevated. She is asymptomatic with slightly elevated TSH. Will check for potential underlying parathyroid disease.   Relevant past medical, surgical, family, and social history reviewed and updated as indicated.  Allergies and medications reviewed and updated. Data reviewed: Chart in Epic.   Past Medical History:  Diagnosis Date   Elevated blood pressure    no meds at this time   GERD (gastroesophageal reflux disease)    Hx of echocardiogram    Echo 6/14: EF 55-65%, normal wall motion.   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    Past Surgical History:  Procedure Laterality Date   INTRAUTERINE DEVICE (IUD) INSERTION  05-16-11   Mirena   WISDOM TOOTH EXTRACTION      Social History   Socioeconomic History   Marital status: Married    Spouse name: Not  on file   Number of children: 2   Years of education: Not on file   Highest education level: Not on file  Occupational History   Not on file  Tobacco Use   Smoking status: Never   Smokeless tobacco: Never  Vaping Use   Vaping Use: Never used  Substance and Sexual Activity   Alcohol use: Not Currently   Drug use: No   Sexual activity: Yes    Birth control/protection: I.U.D.  Other Topics Concern   Not on file  Social History Narrative   Not on file   Social Determinants of Health   Financial Resource Strain: Not on file  Food Insecurity: Not on file  Transportation Needs: Not on file  Physical Activity: Not on file  Stress: Not on file  Social Connections: Not on file  Intimate Partner Violence: Not on file    Outpatient Encounter Medications as of 10/19/2022  Medication Sig   Cholecalciferol (VITAMIN D3) 50 MCG (2000 UT) CAPS Take 1 capsule by mouth daily at 2 PM.   levonorgestrel (MIRENA) 20 MCG/24HR IUD 1 each by Intrauterine route once.   levothyroxine (SYNTHROID) 137 MCG tablet Take 137 mcg by mouth daily.   Red Yeast Rice Extract (RED YEAST RICE PO) Take 2,400 mg by mouth.   No facility-administered encounter medications on file as of 10/19/2022.    Allergies  Allergen Reactions   Bee Pollen    Dust Mite Extract    Pollen Extract     Review of Systems  Constitutional:  Negative for  activity change, appetite change, chills, diaphoresis, fatigue, fever, malaise/fatigue and unexpected weight change.  HENT: Negative.    Eyes: Negative.  Negative for blurred vision, photophobia and visual disturbance.  Respiratory:  Negative for cough, chest tightness and shortness of breath.   Cardiovascular:  Negative for chest pain, palpitations, orthopnea, leg swelling and PND.  Gastrointestinal:  Negative for abdominal pain, blood in stool, constipation, diarrhea, nausea and vomiting.  Endocrine: Negative.  Negative for cold intolerance and heat intolerance.  Genitourinary:   Negative for decreased urine volume, difficulty urinating, dysuria, frequency and urgency.  Musculoskeletal:  Negative for arthralgias, myalgias and neck pain.  Skin: Negative.   Allergic/Immunologic: Negative.   Neurological:  Negative for dizziness, tremors, seizures, syncope, facial asymmetry, speech difficulty, weakness, light-headedness, numbness and headaches.  Hematological: Negative.   Psychiatric/Behavioral:  Negative for confusion, hallucinations, sleep disturbance and suicidal ideas.   All other systems reviewed and are negative.       Objective:  BP 132/86 Comment: at home  Pulse 97   Temp 97.8 F (36.6 C) (Temporal)   Ht 5' 6.5" (1.689 m)   Wt 203 lb 6.4 oz (92.3 kg)   SpO2 99%   BMI 32.34 kg/m    Wt Readings from Last 3 Encounters:  10/19/22 203 lb 6.4 oz (92.3 kg)  09/07/22 208 lb 9.6 oz (94.6 kg)  02/09/13 198 lb (89.8 kg)    Physical Exam Vitals and nursing note reviewed.  Constitutional:      General: She is not in acute distress.    Appearance: Normal appearance. She is obese. She is not ill-appearing, toxic-appearing or diaphoretic.  HENT:     Head: Normocephalic and atraumatic.     Mouth/Throat:     Mouth: Mucous membranes are moist.  Eyes:     Conjunctiva/sclera: Conjunctivae normal.     Pupils: Pupils are equal, round, and reactive to light.  Cardiovascular:     Rate and Rhythm: Normal rate and regular rhythm.     Heart sounds: Normal heart sounds.  Pulmonary:     Effort: Pulmonary effort is normal.     Breath sounds: Normal breath sounds.  Musculoskeletal:     Right lower leg: No edema.     Left lower leg: No edema.  Skin:    General: Skin is warm and dry.     Capillary Refill: Capillary refill takes less than 2 seconds.  Neurological:     General: No focal deficit present.     Mental Status: She is alert and oriented to person, place, and time.  Psychiatric:        Mood and Affect: Mood normal.        Behavior: Behavior normal.         Thought Content: Thought content normal.        Judgment: Judgment normal.     Results for orders placed or performed in visit on 09/07/22  CMP14+EGFR  Result Value Ref Range   Glucose 91 70 - 99 mg/dL   BUN 15 6 - 24 mg/dL   Creatinine, Ser 7.93 0.57 - 1.00 mg/dL   eGFR 903 >00 PQ/ZRA/0.76   BUN/Creatinine Ratio 21 9 - 23   Sodium 139 134 - 144 mmol/L   Potassium 4.8 3.5 - 5.2 mmol/L   Chloride 100 96 - 106 mmol/L   CO2 24 20 - 29 mmol/L   Calcium 10.6 (H) 8.7 - 10.2 mg/dL   Total Protein 8.6 (H) 6.0 - 8.5 g/dL   Albumin 4.9  3.9 - 4.9 g/dL   Globulin, Total 3.7 1.5 - 4.5 g/dL   Albumin/Globulin Ratio 1.3 1.2 - 2.2   Bilirubin Total 0.3 0.0 - 1.2 mg/dL   Alkaline Phosphatase 98 44 - 121 IU/L   AST 27 0 - 40 IU/L   ALT 15 0 - 32 IU/L  CBC with Differential/Platelet  Result Value Ref Range   WBC 6.3 3.4 - 10.8 x10E3/uL   RBC 5.69 (H) 3.77 - 5.28 x10E6/uL   Hemoglobin 15.5 11.1 - 15.9 g/dL   Hematocrit 16.1 (H) 09.6 - 46.6 %   MCV 85 79 - 97 fL   MCH 27.2 26.6 - 33.0 pg   MCHC 32.0 31.5 - 35.7 g/dL   RDW 04.5 40.9 - 81.1 %   Platelets 285 150 - 450 x10E3/uL   Neutrophils 51 Not Estab. %   Lymphs 34 Not Estab. %   Monocytes 10 Not Estab. %   Eos 4 Not Estab. %   Basos 1 Not Estab. %   Neutrophils Absolute 3.3 1.4 - 7.0 x10E3/uL   Lymphocytes Absolute 2.1 0.7 - 3.1 x10E3/uL   Monocytes Absolute 0.6 0.1 - 0.9 x10E3/uL   EOS (ABSOLUTE) 0.3 0.0 - 0.4 x10E3/uL   Basophils Absolute 0.0 0.0 - 0.2 x10E3/uL   Immature Granulocytes 0 Not Estab. %   Immature Grans (Abs) 0.0 0.0 - 0.1 x10E3/uL  Lipid panel  Result Value Ref Range   Cholesterol, Total 242 (H) 100 - 199 mg/dL   Triglycerides 72 0 - 149 mg/dL   HDL 78 >91 mg/dL   VLDL Cholesterol Cal 12 5 - 40 mg/dL   LDL Chol Calc (NIH) 478 (H) 0 - 99 mg/dL   Chol/HDL Ratio 3.1 0.0 - 4.4 ratio  Thyroid Panel With TSH  Result Value Ref Range   TSH 5.030 (H) 0.450 - 4.500 uIU/mL   T4, Total 11.0 4.5 - 12.0 ug/dL   T3 Uptake  Ratio 24 24 - 39 %   Free Thyroxine Index 2.6 1.2 - 4.9  VITAMIN D 25 Hydroxy (Vit-D Deficiency, Fractures)  Result Value Ref Range   Vit D, 25-Hydroxy 48.6 30.0 - 100.0 ng/mL       Pertinent labs & imaging results that were available during my care of the patient were reviewed by me and considered in my medical decision making.  Assessment & Plan:  Michaela was seen today for hypertension.  Diagnoses and all orders for this visit:  Essential hypertension Better reading with significant  lifestyle changes. Calcium was minimally elevated. Will check parathyroid function. Continue with DASH diet and exercise. BP log provided, pt to check at least 3 times per week and bring log to next visit.  -     PTH, Intact and Calcium -     Calcium, ionized -     BMP8+EGFR  Serum calcium elevated Graves disease Calcium was minimally elevated. Will check parathyroid function. -     PTH, Intact and Calcium -     Calcium, ionized -     BMP8+EGFR    Continue all other maintenance medications.  Follow up plan: Return in about 3 months (around 01/18/2023), or if symptoms worsen or fail to improve, for chronic follow up.   Continue healthy lifestyle choices, including diet (rich in fruits, vegetables, and lean proteins, and low in salt and simple carbohydrates) and exercise (at least 30 minutes of moderate physical activity daily).  Educational handout given for DASH diet, HTN  The above assessment and management plan  was discussed with the patient. The patient verbalized understanding of and has agreed to the management plan. Patient is aware to call the clinic if they develop any new symptoms or if symptoms persist or worsen. Patient is aware when to return to the clinic for a follow-up visit. Patient educated on when it is appropriate to go to the emergency department.   Monia Pouch, FNP-C [facility] [phone]

## 2022-10-19 NOTE — Addendum Note (Signed)
Addended by: Tariyah Pendry M on: 10/19/2022 09:53 PM   Modules accepted: Level of Service  

## 2022-10-22 LAB — CALCIUM, IONIZED: Calcium, Ion: 5.4 mg/dL (ref 4.5–5.6)

## 2022-10-22 LAB — PTH, INTACT AND CALCIUM: PTH: 27 pg/mL (ref 15–65)

## 2022-10-22 LAB — BMP8+EGFR
BUN/Creatinine Ratio: 17 (ref 9–23)
BUN: 13 mg/dL (ref 6–24)
CO2: 22 mmol/L (ref 20–29)
Calcium: 10.3 mg/dL — ABNORMAL HIGH (ref 8.7–10.2)
Chloride: 102 mmol/L (ref 96–106)
Creatinine, Ser: 0.76 mg/dL (ref 0.57–1.00)
Glucose: 93 mg/dL (ref 70–99)
Potassium: 4.5 mmol/L (ref 3.5–5.2)
Sodium: 140 mmol/L (ref 134–144)
eGFR: 96 mL/min/{1.73_m2} (ref >59)

## 2023-01-22 ENCOUNTER — Ambulatory Visit (INDEPENDENT_AMBULATORY_CARE_PROVIDER_SITE_OTHER): Payer: BC Managed Care – PPO | Admitting: Family Medicine

## 2023-01-22 ENCOUNTER — Encounter: Payer: Self-pay | Admitting: Family Medicine

## 2023-01-22 VITALS — BP 130/74 | HR 97 | Temp 97.8°F | Resp 20 | Ht 66.5 in | Wt 192.2 lb

## 2023-01-22 DIAGNOSIS — E890 Postprocedural hypothyroidism: Secondary | ICD-10-CM

## 2023-01-22 DIAGNOSIS — I10 Essential (primary) hypertension: Secondary | ICD-10-CM

## 2023-01-22 NOTE — Progress Notes (Signed)
Subjective:  Patient ID: Sheena Miller, female    DOB: Jan 13, 1971, 52 y.o.   MRN: [id number]  Patient Care Team: Sonny Masters, FNP as PCP - General (Family Medicine)   Chief Complaint:  Medical Management of Chronic Issues   HPI: Sheena Miller is a 52 y.o. female presenting on 01/22/2023 for Medical Management of Chronic Issues   Hypertension Complaint with meds - no medications. Managing with diet and exercise Current Medications - none Checking BP at home ranging 120s-140s/70s Exercising Regularly - Yes Watching Salt intake - Yes Pertinent ROS:  Headache - No Fatigue - No Visual Disturbances - No Chest pain - No Dyspnea - No Palpitations - No LE edema - No They report good compliance with medications and can restate their regimen by memory. No medication side effects.  BP Readings from Last 3 Encounters:  01/22/23 130/74  10/19/22 132/86  09/07/22 126/82   Hypothyroidism  Compliant with medications - Yes Current medications - synthroid Adverse side effects - No Weight - 11 pounds down from previous visit  Bowel habit changes - No Heat or cold intolerance - No Mood changes - No Changes in sleep habits - No Fatigue - No Skin, hair, or nail changes - No Tremor - No Palpitations - No Edema - No Shortness of breath - No Changes in menses - post menopausal   Relevant past medical, surgical, family, and social history reviewed and updated as indicated.  Allergies and medications reviewed and updated. Data reviewed: Chart in Epic.   Past Medical History:  Diagnosis Date   Elevated blood pressure    no meds at this time   GERD (gastroesophageal reflux disease)    Hx of echocardiogram    Echo 6/14: EF 55-65%, normal wall motion.   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    Past Surgical  History:  Procedure Laterality Date   INTRAUTERINE DEVICE (IUD) INSERTION  05-16-11   Mirena   WISDOM TOOTH EXTRACTION      Social History   Socioeconomic History   Marital status: Married    Spouse name: Not on file   Number of children: 2   Years of education: Not on file   Highest education level: Some college, no degree  Occupational History   Not on file  Tobacco Use   Smoking status: Never   Smokeless tobacco: Never  Vaping Use   Vaping status: Never Used  Substance and Sexual Activity   Alcohol use: Not Currently   Drug use: No   Sexual activity: Yes    Birth control/protection: I.U.D.  Other Topics Concern   Not on file  Social History Narrative   Not on file   Social Determinants of Health   Financial Resource Strain: Low Risk  (01/21/2023)   Overall Financial Resource Strain (CARDIA)    Difficulty of Paying Living Expenses: Not hard at all  Food Insecurity: No Food Insecurity (01/21/2023)   Hunger Vital Sign    Worried About Running Out of Food in the Last Year: Never true    Ran Out of Food in the Last Year: Never true  Transportation Needs: No Transportation Needs (01/21/2023)   PRAPARE - Administrator, Civil Service (Medical): No    Lack of Transportation (Non-Medical): No  Physical Activity:  Sufficiently Active (01/21/2023)   Exercise Vital Sign    Days of Exercise per Week: 3 days    Minutes of Exercise per Session: 150+ min  Stress: No Stress Concern Present (01/21/2023)   [employer] of Occupational Health - Occupational Stress Questionnaire    Feeling of Stress : Not at all  Social Connections: Unknown (01/21/2023)   Social Connection and Isolation Panel [NHANES]    Frequency of Communication with Friends and Family: Twice a week    Frequency of Social Gatherings with Friends and Family: Twice a week    Attends Religious Services: More than 4 times per year    Active Member of [employer] or Organizations: Not on file    Attends Tax inspector Meetings: Not on file    Marital Status: Married  Intimate Partner Violence: Not on file    Outpatient Encounter Medications as of 01/22/2023  Medication Sig   Cholecalciferol (VITAMIN D3) 50 MCG (2000 UT) CAPS Take 1 capsule by mouth daily at 2 PM.   levonorgestrel (MIRENA) 20 MCG/24HR IUD 1 each by Intrauterine route once.   levothyroxine (SYNTHROID) 137 MCG tablet Take 137 mcg by mouth daily.   Red Yeast Rice Extract (RED YEAST RICE PO) Take 2,400 mg by mouth.   No facility-administered encounter medications on file as of 01/22/2023.    Allergies  Allergen Reactions   Bee Pollen    Dust Mite Extract    Pollen Extract     Review of Systems  Constitutional: Negative.  Negative for activity change, appetite change, chills, fatigue and fever.  HENT: Negative.    Eyes: Negative.   Respiratory: Negative.  Negative for cough, chest tightness and shortness of breath.   Cardiovascular: Negative.  Negative for chest pain, palpitations and leg swelling.  Gastrointestinal: Negative.  Negative for blood in stool, constipation, diarrhea, nausea and vomiting.  Endocrine: Negative.   Genitourinary:  Negative for dysuria, frequency, menstrual problem (post menopausal March 2024) and urgency.  Musculoskeletal: Negative.  Negative for arthralgias and myalgias.  Skin: Negative.   Allergic/Immunologic: Negative.   Neurological: Negative.  Negative for dizziness and headaches.  Hematological: Negative.   Psychiatric/Behavioral: Negative.  Negative for confusion, hallucinations, self-injury, sleep disturbance and suicidal ideas.   All other systems reviewed and are negative.       Objective:  BP 130/74   Pulse 97   Temp 97.8 F (36.6 C) (Oral)   Resp 20   Ht 5' 6.5" (1.689 m)   Wt 192 lb 4 oz (87.2 kg)   SpO2 100%   BMI 30.57 kg/m    Wt Readings from Last 3 Encounters:  01/22/23 192 lb 4 oz (87.2 kg)  10/19/22 203 lb 6.4 oz (92.3 kg)  09/07/22 208 lb 9.6 oz (94.6 kg)     Physical Exam Vitals and nursing note reviewed.  Constitutional:      Appearance: Normal appearance. She is obese.  HENT:     Head: Normocephalic and atraumatic.     Nose: Nose normal.     Mouth/Throat:     Mouth: Mucous membranes are moist.  Eyes:     Extraocular Movements: Extraocular movements intact.     Pupils: Pupils are equal, round, and reactive to light.  Cardiovascular:     Rate and Rhythm: Normal rate and regular rhythm.     Pulses: Normal pulses.     Heart sounds: Normal heart sounds.  Pulmonary:     Effort: Pulmonary effort is normal.  Breath sounds: Normal breath sounds.  Abdominal:     General: Abdomen is flat.     Palpations: Abdomen is soft.  Musculoskeletal:        General: Normal range of motion.     Cervical back: Normal range of motion and neck supple.     Right lower leg: No edema.     Left lower leg: No edema.  Skin:    General: Skin is warm.     Capillary Refill: Capillary refill takes less than 2 seconds.  Neurological:     General: No focal deficit present.     Mental Status: She is alert and oriented to person, place, and time.  Psychiatric:        Mood and Affect: Mood normal.        Behavior: Behavior normal.        Thought Content: Thought content normal.        Judgment: Judgment normal.     Results for orders placed or performed in visit on 10/19/22  PTH, Intact and Calcium  Result Value Ref Range   PTH 27 15 - 65 pg/mL   PTH Interp Comment   Calcium, ionized  Result Value Ref Range   Calcium, Ion 5.4 4.5 - 5.6 mg/dL  GUY4+IHKV  Result Value Ref Range   Glucose 93 70 - 99 mg/dL   BUN 13 6 - 24 mg/dL   Creatinine, Ser 4.25 0.57 - 1.00 mg/dL   eGFR 96 >95 GL/OVF/6.43   BUN/Creatinine Ratio 17 9 - 23   Sodium 140 134 - 144 mmol/L   Potassium 4.5 3.5 - 5.2 mmol/L   Chloride 102 96 - 106 mmol/L   CO2 22 20 - 29 mmol/L   Calcium 10.3 (H) 8.7 - 10.2 mg/dL       Pertinent labs & imaging results that were available during  my care of the patient were reviewed by me and considered in my medical decision making.  Assessment & Plan:  Cedrica was seen today for medical management of chronic issues.  Diagnoses and all orders for this visit:  Hypertension, unspecified type   BP well controlled. Changes were not made in regimen today. Goal BP is 130/80. Pt aware to report any persistent high or low readings. DASH diet and exercise encouraged. Exercise at least 150 minutes per week and increase as tolerated. Goal BMI > 25. Stress management encouraged. Avoid nicotine and tobacco product use. Avoid excessive alcohol and NSAID's. Avoid more than 2000 mg of sodium daily. Medications as prescribed. Follow up as scheduled.      -    CBC with differential      -    Basic Metabolic Panel  Hypothyroid Thyroid disease has been well controlled. Labs are pending. Adjustments to regimen will be made if warranted. Make sure to take medications on an empty stomach with a full glass of water. Make sure to avoid vitamins or supplements for at least 4 hours before and 4 hours after taking medications. Repeat labs in 3 months if adjustments are made and in 6 months if stable.      -     Thyroid Panel    Continue all other maintenance medications.  Follow up plan: Return in about 6 months (around 07/25/2023) for CPE. synthroid may need adjustment after lab results return  01/23/2023  Continue healthy lifestyle choices, including diet (rich in fruits, vegetables, and lean proteins, and low in salt and simple carbohydrates) and exercise (at least 30  minutes of moderate physical activity daily).  Educational handout given for DASH and HTN  The above assessment and management plan was discussed with the patient. The patient verbalized understanding of and has agreed to the management plan. Patient is aware to call the clinic if they develop any new symptoms or if symptoms persist or worsen. Patient is aware when to return to the clinic for  a follow-up visit. Patient educated on when it is appropriate to go to the emergency department.   Maryelizabeth Kaufmann NP student [facility] [phone]  I personally was present during the history, physical exam, and medical decision-making activities of this visit and have verified that the services and findings are accurately documented in the nurse practitioner student's note.  Kari Baars, FNP-C [facility] [address] [place], [place] [address] [phone]

## 2023-01-22 NOTE — Patient Instructions (Signed)

## 2023-01-23 LAB — BASIC METABOLIC PANEL
BUN/Creatinine Ratio: 14 (ref 9–23)
BUN: 10 mg/dL (ref 6–24)
CO2: 25 mmol/L (ref 20–29)
Calcium: 10.3 mg/dL — ABNORMAL HIGH (ref 8.7–10.2)
Chloride: 101 mmol/L (ref 96–106)
Creatinine, Ser: 0.72 mg/dL (ref 0.57–1.00)
Glucose: 76 mg/dL (ref 70–99)
Potassium: 4.5 mmol/L (ref 3.5–5.2)
Sodium: 139 mmol/L (ref 134–144)
eGFR: 102 mL/min/{1.73_m2} (ref >59)

## 2023-01-23 LAB — CBC WITH DIFFERENTIAL/PLATELET
Basophils Absolute: 0.1 10*3/uL (ref 0.0–0.2)
Basos: 1 %
EOS (ABSOLUTE): 0.3 10*3/uL (ref 0.0–0.4)
Eos: 5 %
Hematocrit: 46.1 % (ref 34.0–46.6)
Hemoglobin: 14.3 g/dL (ref 11.1–15.9)
Immature Grans (Abs): 0 10*3/uL (ref 0.0–0.1)
Immature Granulocytes: 0 %
Lymphocytes Absolute: 2 10*3/uL (ref 0.7–3.1)
Lymphs: 39 %
MCH: 27.2 pg (ref 26.6–33.0)
MCHC: 31 g/dL — ABNORMAL LOW (ref 31.5–35.7)
MCV: 88 fL (ref 79–97)
Monocytes Absolute: 0.6 10*3/uL (ref 0.1–0.9)
Monocytes: 11 %
Neutrophils Absolute: 2.2 10*3/uL (ref 1.4–7.0)
Neutrophils: 44 %
Platelets: 253 10*3/uL (ref 150–450)
RBC: 5.26 x10E6/uL (ref 3.77–5.28)
RDW: 13.1 % (ref 11.7–15.4)
WBC: 5 10*3/uL (ref 3.4–10.8)

## 2023-01-23 LAB — THYROID PANEL WITH TSH
Free Thyroxine Index: 3.4 (ref 1.2–4.9)
T3 Uptake Ratio: 28 % (ref 24–39)
T4, Total: 12.2 ug/dL — ABNORMAL HIGH (ref 4.5–12.0)
TSH: 0.644 u[IU]/mL (ref 0.450–4.500)

## 2023-07-25 ENCOUNTER — Encounter: Payer: BC Managed Care – PPO | Admitting: Family Medicine

## 2023-09-20 ENCOUNTER — Ambulatory Visit: Payer: BC Managed Care – PPO | Admitting: Family Medicine

## 2023-09-20 ENCOUNTER — Encounter: Payer: Self-pay | Admitting: Family Medicine

## 2023-09-20 VITALS — BP 142/84 | HR 96 | Temp 97.2°F | Ht 66.5 in | Wt 184.0 lb

## 2023-09-20 DIAGNOSIS — Z136 Encounter for screening for cardiovascular disorders: Secondary | ICD-10-CM

## 2023-09-20 DIAGNOSIS — Z1159 Encounter for screening for other viral diseases: Secondary | ICD-10-CM | POA: Diagnosis not present

## 2023-09-20 DIAGNOSIS — Z130 Encounter for screening for diseases of the blood and blood-forming organs and certain disorders involving the immune mechanism: Secondary | ICD-10-CM

## 2023-09-20 DIAGNOSIS — Z114 Encounter for screening for human immunodeficiency virus [HIV]: Secondary | ICD-10-CM

## 2023-09-20 DIAGNOSIS — E890 Postprocedural hypothyroidism: Secondary | ICD-10-CM

## 2023-09-20 DIAGNOSIS — Z1211 Encounter for screening for malignant neoplasm of colon: Secondary | ICD-10-CM | POA: Diagnosis not present

## 2023-09-20 DIAGNOSIS — Z0001 Encounter for general adult medical examination with abnormal findings: Secondary | ICD-10-CM

## 2023-09-20 DIAGNOSIS — Z0000 Encounter for general adult medical examination without abnormal findings: Secondary | ICD-10-CM

## 2023-09-20 DIAGNOSIS — I10 Essential (primary) hypertension: Secondary | ICD-10-CM

## 2023-09-20 DIAGNOSIS — Z1212 Encounter for screening for malignant neoplasm of rectum: Secondary | ICD-10-CM

## 2023-09-20 DIAGNOSIS — E559 Vitamin D deficiency, unspecified: Secondary | ICD-10-CM

## 2023-09-20 LAB — BAYER DCA HB A1C WAIVED: HB A1C (BAYER DCA - WAIVED): 5 % (ref 4.8–5.6)

## 2023-09-20 NOTE — Progress Notes (Signed)
 Complete physical exam  Patient: Sheena Miller   DOB: 01-30-1971   53 y.o. Female  MRN: [id number]  Subjective:    Chief Complaint  Patient presents with   Annual Exam    Sheena Miller is a 53 y.o. female who presents today for a complete physical exam. She reports consuming a general diet.  She walks at least 5 times per week.  She generally feels well. She reports sleeping well. She does not have additional problems to discuss today.    History of Present Illness   Sheena Miller is a 53 year old female who presents for an annual physical exam.  She is focused on maintaining her health and is currently taking levothyroxine 137 mcg for thyroid management. She feels her thyroid is well-managed with no symptoms of weight gain, fatigue, or insomnia. She has a Mirena IUD with no breakthrough bleeding and plans to have it evaluated in May.  She experiences sleep disturbances due to hot flashes and manages this by avoiding drinking water after dinner, which she eats before 8 PM.  She mentions a pain near her ribcage that occurs mostly after exercising. It is described as an ache that is exacerbated by touching the area.  She is taking red yeast rice for cholesterol management and reports no issues with it.  No family history of colorectal cancer. Family history includes high blood pressure and some diabetes, though she is unsure of the type.       Most recent fall risk assessment:    09/20/2023    8:11 AM  Fall Risk   Falls in the past year? 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   Past Surgical History:  Procedure Laterality Date   INTRAUTERINE DEVICE (IUD) INSERTION  05-16-11   Mirena   WISDOM TOOTH EXTRACTION     Social History   Tobacco Use   Smoking status: Never   Smokeless tobacco: Never  Vaping Use   Vaping status: Never Used  Substance Use Topics   Alcohol use: Not Currently   Drug use: No   Social History   Socioeconomic History   Marital status: Married    Spouse name: Not on file   Number of children: 2   Years of education: Not on file   Highest education level: Some college, no degree  Occupational History   Not on file  Tobacco Use  Smoking status: Never   Smokeless tobacco: Never  Vaping Use   Vaping status: Never Used  Substance and Sexual Activity   Alcohol use: Not Currently   Drug use: No   Sexual activity: Yes    Birth control/protection: I.U.D.  Other Topics Concern   Not on file  Social History Narrative   Not on file   Social Drivers of Health   Financial Resource Strain: Low Risk  (09/16/2023)   Overall Financial Resource Strain (CARDIA)    Difficulty of Paying Living Expenses: Not hard at all  Food Insecurity: No Food Insecurity (09/16/2023)   Hunger Vital Sign    Worried About Running Out of Food in the Last Year: Never true    Ran Out of Food in the Last Year: Never true  Transportation Needs: No Transportation Needs (09/16/2023)   PRAPARE - Administrator, Civil Service  (Medical): No    Lack of Transportation (Non-Medical): No  Physical Activity: Sufficiently Active (09/16/2023)   Exercise Vital Sign    Days of Exercise per Week: 5 days    Minutes of Exercise per Session: 60 min  Stress: No Stress Concern Present (09/16/2023)   [employer] of Occupational Health - Occupational Stress Questionnaire    Feeling of Stress : Not at all  Social Connections: Socially Integrated (09/16/2023)   Social Connection and Isolation Panel [NHANES]    Frequency of Communication with Friends and Family: More than three times a week    Frequency of Social Gatherings with Friends and Family: Twice a week    Attends Religious Services: More than 4 times per year    Active Member of [employer] or Organizations: Yes    Attends Engineer, structural: More than 4 times per year    Marital Status: Married  Catering manager Violence: Not on file   Family Status  Relation Name Status   Mother  Alive   Sister  Alive   Brother  Alive   Daughter 2 Alive   MGM  Deceased   MGF  Deceased   Other  (Not Specified)  No partnership data on file   Family History  Problem Relation Age of Onset   Hypertension Mother    Hypertension Sister    Hypertension Other    Allergies  Allergen Reactions   Bee Pollen    Dust Mite Extract    Pollen Extract       Patient Care Team: Sonny Masters, FNP as PCP - General (Family Medicine)   Outpatient Medications Prior to Visit  Medication Sig   Cholecalciferol (VITAMIN D3) 50 MCG (2000 UT) CAPS Take 1 capsule by mouth daily at 2 PM.   levonorgestrel (MIRENA) 20 MCG/24HR IUD 1 each by Intrauterine route once.   levothyroxine (SYNTHROID) 137 MCG tablet Take 137 mcg by mouth daily.   Red Yeast Rice Extract (RED YEAST RICE PO) Take 2,400 mg by mouth.   No facility-administered medications prior to visit.    ROS per HPI     Objective:     BP (!) 142/84   Pulse 96   Temp (!) 97.2 F (36.2 C)   Ht 5' 6.5" (1.689 m)   Wt  184 lb (83.5 kg)   SpO2 99%   BMI 29.25 kg/m  BP Readings from Last 3 Encounters:  09/20/23 (!) 142/84  01/22/23 130/74  10/19/22 132/86   Wt Readings from Last 3 Encounters:  09/20/23 184 lb (83.5 kg)  01/22/23 192 lb 4  oz (87.2 kg)  10/19/22 203 lb 6.4 oz (92.3 kg)   SpO2 Readings from Last 3 Encounters:  09/20/23 99%  01/22/23 100%  10/19/22 99%      Physical Exam Vitals and nursing note reviewed.  Constitutional:      Appearance: Normal appearance. She is well-developed, well-groomed and overweight.  HENT:     Head: Normocephalic and atraumatic.     Right Ear: Tympanic membrane, ear canal and external ear normal.     Left Ear: Tympanic membrane, ear canal and external ear normal.     Nose: Nose normal.     Mouth/Throat:     Mouth: Mucous membranes are moist.     Pharynx: Oropharynx is clear.  Eyes:     Extraocular Movements: Extraocular movements intact.     Conjunctiva/sclera: Conjunctivae normal.     Pupils: Pupils are equal, round, and reactive to light.  Neck:     Vascular: No carotid bruit.  Cardiovascular:     Rate and Rhythm: Normal rate and regular rhythm.     Heart sounds: Normal heart sounds.  Pulmonary:     Effort: Pulmonary effort is normal.     Breath sounds: Normal breath sounds.  Abdominal:     General: Bowel sounds are normal. There is no distension.     Palpations: Abdomen is soft.     Tenderness: There is no abdominal tenderness.  Genitourinary:    Comments: Deferred, sees GYN Musculoskeletal:        General: Normal range of motion.     Cervical back: Neck supple.     Right lower leg: No edema.     Left lower leg: No edema.  Lymphadenopathy:     Cervical: No cervical adenopathy.  Skin:    General: Skin is warm and dry.     Capillary Refill: Capillary refill takes less than 2 seconds.  Neurological:     General: No focal deficit present.     Mental Status: She is alert and oriented to person, place, and time.  Psychiatric:         Mood and Affect: Mood normal.        Behavior: Behavior normal. Behavior is cooperative.        Thought Content: Thought content normal.        Judgment: Judgment normal.      Last CBC Lab Results  Component Value Date   WBC 5.0 01/22/2023   HGB 14.3 01/22/2023   HCT 46.1 01/22/2023   MCV 88 01/22/2023   MCH 27.2 01/22/2023   RDW 13.1 01/22/2023   PLT 253 01/22/2023   Last metabolic panel Lab Results  Component Value Date   GLUCOSE 76 01/22/2023   NA 139 01/22/2023   K 4.5 01/22/2023   CL 101 01/22/2023   CO2 25 01/22/2023   BUN 10 01/22/2023   CREATININE 0.72 01/22/2023   EGFR 102 01/22/2023   CALCIUM 10.3 (H) 01/22/2023   PROT 8.6 (H) 09/07/2022   ALBUMIN 4.9 09/07/2022   LABGLOB 3.7 09/07/2022   AGRATIO 1.3 09/07/2022   BILITOT 0.3 09/07/2022   ALKPHOS 98 09/07/2022   AST 27 09/07/2022   ALT 15 09/07/2022   Last lipids Lab Results  Component Value Date   CHOL 242 (H) 09/07/2022   HDL 78 09/07/2022   LDLCALC 152 (H) 09/07/2022   TRIG 72 09/07/2022   CHOLHDL 3.1 09/07/2022    Last thyroid functions Lab Results  Component Value Date   TSH 0.644 01/22/2023  T4TOTAL 12.2 (H) 01/22/2023   Last vitamin D Lab Results  Component Value Date   VD25OH 48.6 09/07/2022        Assessment & Plan:    Routine Health Maintenance and Physical Exam  Immunization History  Administered Date(s) Administered   Influenza Inj Mdck Quad Pf 04/25/2020   Influenza,inj,Quad PF,6+ Mos 03/09/2022   PFIZER(Purple Top)SARS-COV-2 Vaccination 10/01/2019, 10/24/2019, 05/31/2020, 06/21/2022, 04/16/2023   Tdap 03/14/2022   Zoster Recombinant(Shingrix) 05/14/2023, 08/10/2023    Health Maintenance  Topic Date Due   Fecal DNA (Cologuard)  Never done   COVID-19 Vaccine (6 - 2024-25 season) 10/06/2023 (Originally 06/11/2023)   INFLUENZA VACCINE  10/07/2023 (Originally 02/07/2023)   Hepatitis C Screening  09/19/2024 (Originally 03/17/1991)   HIV Screening  09/19/2024 (Originally  03/16/1988)   MAMMOGRAM  10/04/2023   Cervical Cancer Screening (HPV/Pap Cotest)  10/03/2024   DTaP/Tdap/Td (2 - Td or Tdap) 03/14/2032   Zoster Vaccines- Shingrix  Completed   HPV VACCINES  Aged Out    Discussed health benefits of physical activity, and encouraged her to engage in regular exercise appropriate for her age and condition.  Problem List Items Addressed This Visit       Cardiovascular and Mediastinum   Essential hypertension   Relevant Orders   Lipid panel   CMP14+EGFR   Thyroid Panel With TSH   Anemia Profile B     Endocrine   Postablative hypothyroidism   Relevant Orders   Thyroid Panel With TSH     Other   Vitamin D deficiency   Relevant Orders   CMP14+EGFR   VITAMIN D 25 Hydroxy (Vit-D Deficiency, Fractures)   Other Visit Diagnoses       Annual physical exam    -  Primary   Relevant Orders   Lipid panel   CMP14+EGFR   Thyroid Panel With TSH   VITAMIN D 25 Hydroxy (Vit-D Deficiency, Fractures)   Cologuard   Hepatitis C antibody   HIV Antibody (routine testing w rflx)   Anemia Profile B   Bayer DCA Hb A1c Waived     Screening for colorectal cancer       Relevant Orders   Cologuard     Encounter for screening for HIV       Relevant Orders   HIV Antibody (routine testing w rflx)     Need for hepatitis C screening test       Relevant Orders   Hepatitis C antibody     Encounter for screening for cardiovascular disorders       Relevant Orders   Lipid panel   CMP14+EGFR   Anemia Profile B     Screening for deficiency anemia       Relevant Orders   Anemia Profile B     Assessment and Plan     Graves' Disease Graves' disease managed with levothyroxine 137 mcg. No symptoms of weight gain, fatigue, or insomnia, indicating well-controlled thyroid function.  General Health Maintenance Blood pressure is well-controlled. No family history of colorectal cancer. Regular dental visits and recent eye examination reported. Screening options for  colorectal cancer were discussed, with a preference for Cologuard. One-time HIV and Hepatitis C screening is recommended and covered by insurance. Annual mammograms are advised for early detection of breast cancer. - Order Cologuard test for colorectal cancer screening. - Perform one-time HIV and Hepatitis C screening during blood work. - Order mammogram for breast cancer screening. - Continue monitoring cholesterol levels, currently managed with red yeast rice.  Return in about 1 year (around 09/19/2024) for Annual Physical.     Kari Baars, FNP

## 2023-09-21 ENCOUNTER — Encounter: Payer: Self-pay | Admitting: Family Medicine

## 2023-09-21 LAB — ANEMIA PROFILE B
Basophils Absolute: 0 10*3/uL (ref 0.0–0.2)
Basos: 1 %
EOS (ABSOLUTE): 0.2 10*3/uL (ref 0.0–0.4)
Eos: 5 %
Ferritin: 337 ng/mL — ABNORMAL HIGH (ref 15–150)
Folate: 18.4 ng/mL (ref >3.0)
Hematocrit: 44.8 % (ref 34.0–46.6)
Hemoglobin: 14.1 g/dL (ref 11.1–15.9)
Immature Grans (Abs): 0 10*3/uL (ref 0.0–0.1)
Immature Granulocytes: 0 %
Iron Saturation: 31 % (ref 15–55)
Iron: 95 ug/dL (ref 27–159)
Lymphocytes Absolute: 1.5 10*3/uL (ref 0.7–3.1)
Lymphs: 38 %
MCH: 27.2 pg (ref 26.6–33.0)
MCHC: 31.5 g/dL (ref 31.5–35.7)
MCV: 86 fL (ref 79–97)
Monocytes Absolute: 0.4 10*3/uL (ref 0.1–0.9)
Monocytes: 10 %
Neutrophils Absolute: 1.9 10*3/uL (ref 1.4–7.0)
Neutrophils: 46 %
Platelets: 260 10*3/uL (ref 150–450)
RBC: 5.19 x10E6/uL (ref 3.77–5.28)
RDW: 13.5 % (ref 11.7–15.4)
Retic Ct Pct: 1.1 % (ref 0.6–2.6)
Total Iron Binding Capacity: 310 ug/dL (ref 250–450)
UIBC: 215 ug/dL (ref 131–425)
Vitamin B-12: 1503 pg/mL — ABNORMAL HIGH (ref 232–1245)
WBC: 4 10*3/uL (ref 3.4–10.8)

## 2023-09-21 LAB — THYROID PANEL WITH TSH
Free Thyroxine Index: 4 (ref 1.2–4.9)
T3 Uptake Ratio: 30 % (ref 24–39)
T4, Total: 13.2 ug/dL — ABNORMAL HIGH (ref 4.5–12.0)
TSH: 0.027 u[IU]/mL — ABNORMAL LOW (ref 0.450–4.500)

## 2023-09-21 LAB — CMP14+EGFR
ALT: 15 IU/L (ref 0–32)
AST: 25 IU/L (ref 0–40)
Albumin: 4.7 g/dL (ref 3.9–4.9)
Alkaline Phosphatase: 115 IU/L (ref 44–121)
BUN/Creatinine Ratio: 21 (ref 9–23)
BUN: 13 mg/dL (ref 6–24)
Bilirubin Total: 0.3 mg/dL (ref 0.0–1.2)
CO2: 21 mmol/L (ref 20–29)
Calcium: 10.2 mg/dL (ref 8.7–10.2)
Chloride: 103 mmol/L (ref 96–106)
Creatinine, Ser: 0.62 mg/dL (ref 0.57–1.00)
Globulin, Total: 3.2 g/dL (ref 1.5–4.5)
Glucose: 83 mg/dL (ref 70–99)
Potassium: 4.3 mmol/L (ref 3.5–5.2)
Sodium: 142 mmol/L (ref 134–144)
Total Protein: 7.9 g/dL (ref 6.0–8.5)
eGFR: 108 mL/min/{1.73_m2} (ref >59)

## 2023-09-21 LAB — HEPATITIS C ANTIBODY: Hep C Virus Ab: NONREACTIVE

## 2023-09-21 LAB — LIPID PANEL
Chol/HDL Ratio: 2.9 ratio (ref 0.0–4.4)
Cholesterol, Total: 215 mg/dL — ABNORMAL HIGH (ref 100–199)
HDL: 75 mg/dL (ref >39)
LDL Chol Calc (NIH): 131 mg/dL — ABNORMAL HIGH (ref 0–99)
Triglycerides: 50 mg/dL (ref 0–149)
VLDL Cholesterol Cal: 9 mg/dL (ref 5–40)

## 2023-09-21 LAB — VITAMIN D 25 HYDROXY (VIT D DEFICIENCY, FRACTURES): Vit D, 25-Hydroxy: 54.7 ng/mL (ref 30.0–100.0)

## 2023-09-21 LAB — HIV ANTIBODY (ROUTINE TESTING W REFLEX): HIV Screen 4th Generation wRfx: NONREACTIVE

## 2023-09-29 LAB — COLOGUARD: COLOGUARD: NEGATIVE

## 2023-12-09 ENCOUNTER — Encounter: Payer: Self-pay | Admitting: Family Medicine

## 2023-12-25 ENCOUNTER — Telehealth: Payer: Self-pay

## 2023-12-25 DIAGNOSIS — E890 Postprocedural hypothyroidism: Secondary | ICD-10-CM

## 2023-12-25 DIAGNOSIS — E782 Mixed hyperlipidemia: Secondary | ICD-10-CM

## 2023-12-25 NOTE — Telephone Encounter (Signed)
 Copied from CRM [id number]. Topic: Clinical - Request for Lab/Test Order >> Dec 25, 2023  8:13 AM Adaline Holly wrote: Patient called to schedule 3 month lab recheck for thyroid  and lipids. No lab orders in chart. Please advise.

## 2023-12-25 NOTE — Telephone Encounter (Signed)
 Future lab placed - attempted to contact patient to schedule lab visit and call dropped.  I called patient back and line would not ring.  When patient calls back schedule lab visit

## 2023-12-25 NOTE — Addendum Note (Signed)
 Addended by: Rudolfo Cosier C on: 12/25/2023 11:39 AM   Modules accepted: Orders

## 2023-12-26 NOTE — Addendum Note (Signed)
 Addended by: Lucinda Saber D on: 12/26/2023 09:51 AM   Modules accepted: Orders

## 2023-12-26 NOTE — Telephone Encounter (Signed)
 Patient scheduled for lab draw. Orders placed

## 2023-12-27 ENCOUNTER — Other Ambulatory Visit

## 2023-12-27 DIAGNOSIS — E782 Mixed hyperlipidemia: Secondary | ICD-10-CM

## 2023-12-27 DIAGNOSIS — E890 Postprocedural hypothyroidism: Secondary | ICD-10-CM

## 2023-12-28 LAB — THYROID PANEL WITH TSH
Free Thyroxine Index: 4.4 (ref 1.2–4.9)
T3 Uptake Ratio: 32 % (ref 24–39)
T4, Total: 13.7 ug/dL — ABNORMAL HIGH (ref 4.5–12.0)
TSH: 0.076 u[IU]/mL — ABNORMAL LOW (ref 0.450–4.500)

## 2023-12-28 LAB — LIPID PANEL
Chol/HDL Ratio: 2.6 ratio (ref 0.0–4.4)
Cholesterol, Total: 212 mg/dL — ABNORMAL HIGH (ref 100–199)
HDL: 82 mg/dL (ref >39)
LDL Chol Calc (NIH): 123 mg/dL — ABNORMAL HIGH (ref 0–99)
Triglycerides: 38 mg/dL (ref 0–149)
VLDL Cholesterol Cal: 7 mg/dL (ref 5–40)

## 2023-12-31 ENCOUNTER — Ambulatory Visit: Payer: Self-pay | Admitting: Family Medicine

## 2024-01-06 NOTE — Addendum Note (Signed)
 Addended by: LEIGH ROSINA SAILOR on: 01/06/2024 03:16 PM   Modules accepted: Orders

## 2024-01-06 NOTE — Telephone Encounter (Signed)
 Spoke with pt. Made lab appt for 9/26 at 8:45am. Future orders placed

## 2024-04-03 ENCOUNTER — Other Ambulatory Visit

## 2024-04-03 DIAGNOSIS — E890 Postprocedural hypothyroidism: Secondary | ICD-10-CM

## 2024-04-04 LAB — THYROID PANEL WITH TSH
Free Thyroxine Index: 2.7 (ref 1.2–4.9)
T3 Uptake Ratio: 25 % (ref 24–39)
T4, Total: 10.7 ug/dL (ref 4.5–12.0)
TSH: 0.465 u[IU]/mL (ref 0.450–4.500)

## 2024-04-07 ENCOUNTER — Telehealth: Payer: Self-pay

## 2024-04-07 NOTE — Telephone Encounter (Unsigned)
 Copied from CRM [id number]. Topic: General - Other >> Apr 07, 2024  4:10 PM Roselie C wrote: Reason for [id number] returning call concerning what dosage of Levothyroxine she is currently taking , 137 dosage

## 2024-04-07 NOTE — Telephone Encounter (Signed)
 Noted in result notes.

## 2024-09-23 ENCOUNTER — Encounter: Payer: Self-pay | Admitting: Family Medicine
